# Patient Record
Sex: Female | Born: 1965 | Race: White | Hispanic: No | Marital: Married | Smoking: Never smoker
Health system: Southern US, Community
[De-identification: ages and names within clinical notes are randomized; demographics above are authoritative.]

## PROBLEM LIST (undated history)

## (undated) DIAGNOSIS — K219 Gastro-esophageal reflux disease without esophagitis: Secondary | ICD-10-CM

## (undated) DIAGNOSIS — M1990 Unspecified osteoarthritis, unspecified site: Secondary | ICD-10-CM

## (undated) DIAGNOSIS — E119 Type 2 diabetes mellitus without complications: Secondary | ICD-10-CM

## (undated) DIAGNOSIS — F329 Major depressive disorder, single episode, unspecified: Secondary | ICD-10-CM

## (undated) DIAGNOSIS — E785 Hyperlipidemia, unspecified: Secondary | ICD-10-CM

## (undated) DIAGNOSIS — I10 Essential (primary) hypertension: Secondary | ICD-10-CM

## (undated) DIAGNOSIS — F419 Anxiety disorder, unspecified: Secondary | ICD-10-CM

## (undated) DIAGNOSIS — Z87442 Personal history of urinary calculi: Secondary | ICD-10-CM

## (undated) DIAGNOSIS — F32A Depression, unspecified: Secondary | ICD-10-CM

## (undated) HISTORY — DX: Depression, unspecified: F32.A

## (undated) HISTORY — DX: Hyperlipidemia, unspecified: E78.5

## (undated) HISTORY — DX: Type 2 diabetes mellitus without complications: E11.9

## (undated) HISTORY — DX: Essential (primary) hypertension: I10

## (undated) HISTORY — DX: Anxiety disorder, unspecified: F41.9

## (undated) HISTORY — DX: Major depressive disorder, single episode, unspecified: F32.9

---

## 2004-05-21 ENCOUNTER — Ambulatory Visit: Payer: Self-pay | Admitting: Family Medicine

## 2007-11-29 ENCOUNTER — Ambulatory Visit: Payer: Self-pay | Admitting: Obstetrics and Gynecology

## 2008-02-15 HISTORY — PX: BACK SURGERY: SHX140

## 2008-12-25 ENCOUNTER — Ambulatory Visit: Payer: Self-pay

## 2008-12-31 ENCOUNTER — Ambulatory Visit: Payer: Self-pay

## 2009-01-01 ENCOUNTER — Ambulatory Visit: Payer: Self-pay | Admitting: Obstetrics and Gynecology

## 2009-02-04 ENCOUNTER — Ambulatory Visit: Payer: Self-pay | Admitting: Unknown Physician Specialty

## 2009-02-10 ENCOUNTER — Ambulatory Visit: Payer: Self-pay | Admitting: Unknown Physician Specialty

## 2010-08-26 ENCOUNTER — Ambulatory Visit: Payer: Self-pay | Admitting: Obstetrics and Gynecology

## 2011-08-30 ENCOUNTER — Ambulatory Visit: Payer: Self-pay | Admitting: Obstetrics and Gynecology

## 2012-09-14 HISTORY — PX: BREAST BIOPSY: SHX20

## 2012-09-19 ENCOUNTER — Ambulatory Visit: Payer: Self-pay | Admitting: Obstetrics and Gynecology

## 2012-09-27 ENCOUNTER — Ambulatory Visit: Payer: Self-pay | Admitting: Obstetrics and Gynecology

## 2012-09-27 ENCOUNTER — Encounter: Payer: Self-pay | Admitting: General Surgery

## 2012-10-04 ENCOUNTER — Ambulatory Visit (INDEPENDENT_AMBULATORY_CARE_PROVIDER_SITE_OTHER): Payer: BC Managed Care – PPO | Admitting: General Surgery

## 2012-10-04 ENCOUNTER — Encounter: Payer: Self-pay | Admitting: General Surgery

## 2012-10-04 VITALS — BP 130/72 | HR 76 | Resp 14 | Ht 67.0 in | Wt 277.0 lb

## 2012-10-04 DIAGNOSIS — N63 Unspecified lump in unspecified breast: Secondary | ICD-10-CM

## 2012-10-04 DIAGNOSIS — R928 Other abnormal and inconclusive findings on diagnostic imaging of breast: Secondary | ICD-10-CM

## 2012-10-04 NOTE — Patient Instructions (Addendum)

## 2012-10-04 NOTE — Progress Notes (Signed)
Patient ID: [name], female   DOB: 1965-04-08, 47 y.o.   MRN: [id number]  Chief Complaint  Patient presents with  . Other    New Patient Cat 4 Mammogram     HPI Brenda Hancock is a 47 y.o. female who presents for a breast evaluation. The most recent mammogram on 09/19/12 with birad category 4. She performs self breast checks and gets regular mammograms done. She denies any problems with her breasts at this time. No known family history of breast problems.  The patient is accompanied by her mother who was present for the interview and exam. HPI  Past Medical History  Diagnosis Date  . Diabetes mellitus without complication   . Anxiety   . Depression   . Hypertension   . Hyperlipidemia     Past Surgical History  Procedure Laterality Date  . Back surgery  2010    History reviewed. No pertinent family history.  Social History History  Substance Use Topics  . Smoking status: Never Smoker   . Smokeless tobacco: Not on file  . Alcohol Use: Yes    Allergies  Allergen Reactions  . Augmentin [Amoxicillin-Pot Clavulanate] Rash  . Keflex [Cephalexin] Rash    Current Outpatient Prescriptions  Medication Sig Dispense Refill  . buPROPion (WELLBUTRIN SR) 200 MG 12 hr tablet Take 1 tablet by mouth daily.      . hydrochlorothiazide (HYDRODIURIL) 25 MG tablet Take 1 tablet by mouth daily.      Marland Kitchen lisinopril (PRINIVIL,ZESTRIL) 10 MG tablet Take 1 tablet by mouth daily.      . pioglitazone (ACTOS) 30 MG tablet Take 1 tablet by mouth daily.      . simvastatin (ZOCOR) 20 MG tablet Take 1 tablet by mouth daily.      Marland Kitchen topiramate (TOPAMAX) 25 MG capsule Take 1 capsule by mouth daily.      Marland Kitchen venlafaxine XR (EFFEXOR-XR) 150 MG 24 hr capsule Take 1 capsule by mouth daily.       No current facility-administered medications for this visit.    Review of Systems Review of Systems  Constitutional: Negative.   Respiratory: Negative.   Cardiovascular: Negative.     Blood pressure 130/72,  pulse 76, resp. rate 14, height 5\' 7"  (1.702 m), weight 277 lb (125.646 kg), last menstrual period 09/06/2012.  Physical Exam Physical Exam  Constitutional: She is oriented to person, place, and time. She appears well-developed and well-nourished.  Neck: No thyromegaly present.  Cardiovascular: Normal rate, regular rhythm and normal heart sounds.   No murmur heard. Pulmonary/Chest: Effort normal and breath sounds normal. Right breast exhibits no inverted nipple, no mass, no nipple discharge, no skin change and no tenderness. Left breast exhibits no inverted nipple, no mass, no nipple discharge, no skin change and no tenderness.  Left breast bigger than right breast.   Lymphadenopathy:    She has no cervical adenopathy.    She has no axillary adenopathy.  Neurological: She is alert and oriented to person, place, and time.  Skin: Skin is warm and dry.    Data Reviewed Bilateral mammogram [place] 6, 2014 suggest a left retroareolar focal asymmetry. Additional views requested. BI-RAD-0.  Focal spot compression views and ultrasound dated September 27, 2012 identified a 4 x 6 x 11 mm bilobed hypoechoic mass at the 3:00 position the retroareolar area. BI-RAD-4.  Ultrasound examination showed in the superficial tissue immediately adjacent to the base of the nipple at the 3:00 position a 0.6 x 1.1 x  1.25 cm hypoechoic area. This corresponded to the hospital study.  The patient was amenable to back and biopsy.  10 cc of 0.5% Xylocaine with 0.25% Marcaine with 1-200,000 epinephrine was utilized well-tolerated. ChloraPrep was applied to the skin. A 10-gauge Encor device septum 11 mm sample was placed under oversewn guidance and a core samples obtained. Moderate discomfort was appreciated one was superficial biopsies. Scant bleeding was noted. Pain resolved by the end of the procedure. A postbiopsy clip was placed. Skin defect was closed with benzoin and Steri-Strips followed by Telfa and Tegaderm  dressing. Written instructions were provided.  Assessment    Abnormal mammogram. Possible related to ductal dilatation.     Plan    The patient will be contacted when the biopsy results are available. She is aware this will likely be Monday, August 25 of the late hour.        Earline Mayotte 10/05/2012, 7:59 PM

## 2012-10-05 ENCOUNTER — Encounter: Payer: Self-pay | Admitting: General Surgery

## 2012-10-05 DIAGNOSIS — R928 Other abnormal and inconclusive findings on diagnostic imaging of breast: Secondary | ICD-10-CM | POA: Insufficient documentation

## 2012-10-08 LAB — PATHOLOGY

## 2012-10-11 ENCOUNTER — Ambulatory Visit (INDEPENDENT_AMBULATORY_CARE_PROVIDER_SITE_OTHER): Payer: BC Managed Care – PPO | Admitting: *Deleted

## 2012-10-11 DIAGNOSIS — N63 Unspecified lump in unspecified breast: Secondary | ICD-10-CM

## 2012-10-11 NOTE — Patient Instructions (Signed)
Continue self breast exams. Call office for any new breast issues or concerns. 

## 2012-10-11 NOTE — Progress Notes (Signed)
Patient here today for follow up post left breast biopsy.  Small rash noted from dressing, may use benadryl as needed.  Minimal bruising noted.  The patient is aware that a heating pad may be used for comfort as needed.  Aware of pathology. Follow up as scheduled 6 months.

## 2012-10-30 ENCOUNTER — Encounter: Payer: Self-pay | Admitting: General Surgery

## 2013-03-26 ENCOUNTER — Ambulatory Visit: Payer: Self-pay | Admitting: General Surgery

## 2013-03-26 ENCOUNTER — Encounter: Payer: Self-pay | Admitting: General Surgery

## 2013-04-09 ENCOUNTER — Ambulatory Visit: Payer: BC Managed Care – PPO | Admitting: General Surgery

## 2013-04-24 ENCOUNTER — Encounter: Payer: Self-pay | Admitting: General Surgery

## 2013-04-24 ENCOUNTER — Ambulatory Visit (INDEPENDENT_AMBULATORY_CARE_PROVIDER_SITE_OTHER): Payer: BC Managed Care – PPO | Admitting: General Surgery

## 2013-04-24 VITALS — BP 122/72 | HR 76 | Resp 12 | Ht 67.0 in | Wt 263.0 lb

## 2013-04-24 DIAGNOSIS — R928 Other abnormal and inconclusive findings on diagnostic imaging of breast: Secondary | ICD-10-CM

## 2013-04-24 NOTE — Progress Notes (Signed)
Patient ID: Brenda Hancock, female   DOB: Jan 02, 1966, 48 y.o.   MRN: [id number]  Chief Complaint  Patient presents with  . Follow-up    6 month follow up left diagnostic mammogram     HPI Brenda Hancock is a 48 y.o. female who presents for a breast evaluation. The most recent left breast mammogram was done on 03/26/13. Patient does perform regular self breast checks and gets regular mammograms done.  The patient denies any problems with her breasts at this time.    HPI  Past Medical History  Diagnosis Date  . Diabetes mellitus without complication   . Anxiety   . Depression   . Hypertension   . Hyperlipidemia     Past Surgical History  Procedure Laterality Date  . Back surgery  2010    History reviewed. No pertinent family history.  Social History History  Substance Use Topics  . Smoking status: Never Smoker   . Smokeless tobacco: Never Used  . Alcohol Use: Yes    Allergies  Allergen Reactions  . Augmentin [Amoxicillin-Pot Clavulanate] Rash  . Keflex [Cephalexin] Rash    Current Outpatient Prescriptions  Medication Sig Dispense Refill  . buPROPion (WELLBUTRIN SR) 200 MG 12 hr tablet Take 1 tablet by mouth daily.      . hydrochlorothiazide (HYDRODIURIL) 25 MG tablet Take 1 tablet by mouth daily.      Marland Kitchen. lisinopril (PRINIVIL,ZESTRIL) 10 MG tablet Take 1 tablet by mouth daily.      . pioglitazone (ACTOS) 30 MG tablet Take 1 tablet by mouth daily.      . simvastatin (ZOCOR) 20 MG tablet Take 1 tablet by mouth daily.      Marland Kitchen. topiramate (TOPAMAX) 25 MG capsule Take 1 capsule by mouth daily.      Marland Kitchen. venlafaxine XR (EFFEXOR-XR) 150 MG 24 hr capsule Take 1 capsule by mouth daily.       No current facility-administered medications for this visit.    Review of Systems Review of Systems  Constitutional: Negative.   Respiratory: Negative.   Cardiovascular: Negative.     Blood pressure 122/72, pulse 76, resp. rate 12, height 5\' 7"  (1.702 m), weight 263 lb (119.296 kg), last  menstrual period 03/20/2013.  Physical Exam Physical Exam  Constitutional: She is oriented to person, place, and time. She appears well-developed and well-nourished.  Eyes: Conjunctivae are normal.  Neck: Neck supple.  Cardiovascular: Normal rate, regular rhythm and normal heart sounds.   Pulmonary/Chest: Effort normal and breath sounds normal. Right breast exhibits no inverted nipple, no mass, no nipple discharge, no skin change and no tenderness. Left breast exhibits no inverted nipple, no mass, no nipple discharge, no skin change and no tenderness.  Right breast focal  thickening right upper quadrant. Left breast focal thickening in the left upper quadrant.  Lymphadenopathy:    She has no cervical adenopathy.    She has no axillary adenopathy.  Neurological: She is alert and oriented to person, place, and time.  Skin: Skin is warm and dry.    Data Reviewed Left breast mammogram dated March 26, 2013 showed a postbiopsy marker in the subareolar area and left upper quadrant. Previously identified mass is no longer evident. BI-RAD-2.  Assessment    Benign breast exam and mammogram.     Plan    The patient should resume annual screening mammograms with her GYN in fall 2015.        Brenda MayotteByrnett, Brenda Hancock W 04/27/2013, 8:37 PM

## 2013-04-24 NOTE — Patient Instructions (Signed)
Continue self breast exams. Call office for any new breast issues or concerns. Patient to return as needed.   

## 2013-12-16 ENCOUNTER — Encounter: Payer: Self-pay | Admitting: General Surgery

## 2014-03-11 ENCOUNTER — Ambulatory Visit: Payer: Self-pay | Admitting: Obstetrics and Gynecology

## 2014-03-13 ENCOUNTER — Ambulatory Visit: Payer: Self-pay | Admitting: Obstetrics and Gynecology

## 2015-03-24 ENCOUNTER — Other Ambulatory Visit: Payer: Self-pay | Admitting: Obstetrics and Gynecology

## 2015-03-24 DIAGNOSIS — Z1231 Encounter for screening mammogram for malignant neoplasm of breast: Secondary | ICD-10-CM

## 2015-04-01 ENCOUNTER — Ambulatory Visit: Payer: Self-pay

## 2015-04-06 ENCOUNTER — Ambulatory Visit
Admission: RE | Admit: 2015-04-06 | Discharge: 2015-04-06 | Disposition: A | Discharge status: Home / Self Care | Payer: BC Managed Care – PPO | Source: Ambulatory Visit | Attending: Obstetrics and Gynecology | Admitting: Obstetrics and Gynecology

## 2015-04-06 DIAGNOSIS — Z1231 Encounter for screening mammogram for malignant neoplasm of breast: Secondary | ICD-10-CM | POA: Diagnosis present

## 2016-03-01 ENCOUNTER — Other Ambulatory Visit: Payer: Self-pay | Admitting: Obstetrics and Gynecology

## 2016-04-07 ENCOUNTER — Other Ambulatory Visit: Payer: Self-pay | Admitting: Obstetrics and Gynecology

## 2016-04-07 DIAGNOSIS — Z1231 Encounter for screening mammogram for malignant neoplasm of breast: Secondary | ICD-10-CM

## 2016-04-14 ENCOUNTER — Encounter (INDEPENDENT_AMBULATORY_CARE_PROVIDER_SITE_OTHER): Payer: Self-pay

## 2016-04-14 ENCOUNTER — Ambulatory Visit
Admission: RE | Admit: 2016-04-14 | Discharge: 2016-04-14 | Disposition: A | Discharge status: Home / Self Care | Payer: BC Managed Care – PPO | Source: Ambulatory Visit | Attending: Obstetrics and Gynecology | Admitting: Obstetrics and Gynecology

## 2016-04-14 ENCOUNTER — Other Ambulatory Visit: Payer: Self-pay | Admitting: Obstetrics and Gynecology

## 2016-04-14 DIAGNOSIS — Z1231 Encounter for screening mammogram for malignant neoplasm of breast: Secondary | ICD-10-CM | POA: Insufficient documentation

## 2016-07-06 ENCOUNTER — Ambulatory Visit: Payer: BC Managed Care – PPO | Attending: Obstetrics and Gynecology | Admitting: Physical Therapy

## 2016-07-13 ENCOUNTER — Encounter: Payer: BC Managed Care – PPO | Admitting: Physical Therapy

## 2016-07-20 ENCOUNTER — Encounter: Payer: BC Managed Care – PPO | Admitting: Physical Therapy

## 2016-07-27 ENCOUNTER — Encounter: Payer: BC Managed Care – PPO | Admitting: Physical Therapy

## 2016-08-10 ENCOUNTER — Encounter: Payer: BC Managed Care – PPO | Admitting: Physical Therapy

## 2016-08-24 ENCOUNTER — Encounter: Payer: BC Managed Care – PPO | Admitting: Physical Therapy

## 2016-09-07 ENCOUNTER — Encounter: Payer: BC Managed Care – PPO | Admitting: Physical Therapy

## 2017-04-20 ENCOUNTER — Other Ambulatory Visit: Payer: Self-pay | Admitting: Obstetrics and Gynecology

## 2017-04-20 DIAGNOSIS — Z1231 Encounter for screening mammogram for malignant neoplasm of breast: Secondary | ICD-10-CM

## 2017-05-01 ENCOUNTER — Encounter (INDEPENDENT_AMBULATORY_CARE_PROVIDER_SITE_OTHER): Payer: Self-pay

## 2017-05-01 ENCOUNTER — Ambulatory Visit
Admission: RE | Admit: 2017-05-01 | Discharge: 2017-05-01 | Disposition: A | Discharge status: Home / Self Care | Payer: BC Managed Care – PPO | Source: Ambulatory Visit | Attending: Obstetrics and Gynecology | Admitting: Obstetrics and Gynecology

## 2017-05-01 DIAGNOSIS — Z1231 Encounter for screening mammogram for malignant neoplasm of breast: Secondary | ICD-10-CM

## 2018-04-01 ENCOUNTER — Ambulatory Visit
Admission: EM | Admit: 2018-04-01 | Discharge: 2018-04-01 | Disposition: A | Discharge status: Home / Self Care | Payer: BC Managed Care – PPO | Attending: Emergency Medicine | Admitting: Emergency Medicine

## 2018-04-01 DIAGNOSIS — J0140 Acute pansinusitis, unspecified: Secondary | ICD-10-CM | POA: Diagnosis not present

## 2018-04-01 MED ORDER — IBUPROFEN 600 MG PO TABS: 600.0000 mg | ORAL_TABLET | Freq: Four times a day (QID) | ORAL | 0 refills | Status: DC | PRN

## 2018-04-01 MED ORDER — DOXYCYCLINE HYCLATE 100 MG PO CAPS: 100.0000 mg | ORAL_CAPSULE | Freq: Two times a day (BID) | ORAL | 0 refills | Status: AC

## 2018-04-01 NOTE — ED Triage Notes (Signed)
Pt states she is here for sinus infection that has been going on for 3 days. Also states this is her 3rd sinus infection since christmas and the past 2 times she was treated with doxycycline. Facial and sinus pain, eyes burning. Has been taking mucinex sinus without relief.

## 2018-04-01 NOTE — ED Provider Notes (Signed)
HPI  SUBJECTIVE:  Brenda Hancock is a 53 y.o. female who presents with a "sinus infection" for the past 3 days.  States that she has had 2 sinus infections since Christmas.  She did not seek medical attention for the first 1 but took some leftover antibiotics which "cleared it up".  She was then seen by her PMD on January 9 for the same, treated with doxycycline for 10 days and she finished this on 1/19.  States that she got completely better after this course of antibiotics.  She reports nasal congestion, rhinorrhea, postnasal drip, sinus pain pressure, upper dental pain.  She states that her right ear feels clogged, but denies fevers.  She reports a mild cough.  No fevers, facial swelling.  No allergy symptoms.  No antipyretic in the past 4 to 6 hours.  She has tried saline spray, Mucinex sinus and Flonase without improvement in her symptoms.  Symptoms are worse with bending forward.  Past medical history of sinusitis, diabetes, hypertension, allergies.  JEH:[id number], Madaline Guthrie, MD   Past Medical History:  Diagnosis Date  . Anxiety   . Depression   . Diabetes mellitus without complication (HCC)   . Hyperlipidemia   . Hypertension     Past Surgical History:  Procedure Laterality Date  . BACK SURGERY  2010  . BREAST BIOPSY Left 09/2012   neg bx/clip    Family History  Problem Relation Age of Onset  . Hypertension Mother   . Diabetes Mother   . Cancer Father   . Hypertension Father   . Diabetes Father   . Breast cancer Neg Hx     Social History   Tobacco Use  . Smoking status: Never Smoker  . Smokeless tobacco: Never Used  Substance Use Topics  . Alcohol use: Yes  . Drug use: No    No current facility-administered medications for this encounter.   Current Outpatient Medications:  .  buPROPion (WELLBUTRIN SR) 200 MG 12 hr tablet, Take 1 tablet by mouth daily., Disp: , Rfl:  .  hydrochlorothiazide (HYDRODIURIL) 25 MG tablet, Take 1 tablet by mouth daily., Disp: , Rfl:   .  Levonorgestrel (LILETTA) 19.5 MCG/DAY IUD IUD, 1 each by Intrauterine route once., Disp: , Rfl:  .  lisinopril (PRINIVIL,ZESTRIL) 10 MG tablet, Take 1 tablet by mouth daily., Disp: , Rfl:  .  pioglitazone (ACTOS) 30 MG tablet, Take 1 tablet by mouth daily., Disp: , Rfl:  .  simvastatin (ZOCOR) 20 MG tablet, Take 1 tablet by mouth daily., Disp: , Rfl:  .  TRULICITY 0.75 MG/0.5ML SOPN, , Disp: , Rfl:  .  venlafaxine XR (EFFEXOR-XR) 150 MG 24 hr capsule, Take 1 capsule by mouth daily., Disp: , Rfl:  .  doxycycline (VIBRAMYCIN) 100 MG capsule, Take 1 capsule (100 mg total) by mouth 2 (two) times daily for 7 days., Disp: 14 capsule, Rfl: 0 .  ibuprofen (ADVIL,MOTRIN) 600 MG tablet, Take 1 tablet (600 mg total) by mouth every 6 (six) hours as needed., Disp: 30 tablet, Rfl: 0  Allergies  Allergen Reactions  . Augmentin [Amoxicillin-Pot Clavulanate] Rash  . Keflex [Cephalexin] Rash     ROS  As noted in HPI.   Physical Exam  BP (!) 158/89 (BP Location: Right Arm)   Pulse (!) 102   Temp 98.6 F (37 C) (Oral)   Resp 18   Ht 5\' 7"  (1.702 m)   Wt (!) 156 kg   SpO2 97%   BMI 53.88 kg/m  Constitutional: Well developed, well nourished, no acute distress Eyes:  EOMI, conjunctiva normal bilaterally HENT: Normocephalic, atraumatic,mucus membranes moist.  TMs normal bilaterally.  Purulent nasal congestion.  Normal turbinates.  Mild frontal sinus tenderness.  Positive maxillary sinus tenderness.  No obvious postnasal drip. Respiratory: Normal inspiratory effort, lungs clear bilaterally  cardiovascular: Normal rate GI: nondistended skin: No rash, skin intact Musculoskeletal: no deformities Neurologic: Alert & oriented x 3, no focal neuro deficits Psychiatric: Speech and behavior appropriate   ED Course   Medications - No data to display  No orders of the defined types were placed in this encounter.   No results found for this or any previous visit (from the past 24 hour(s)). No  results found.  ED Clinical Impression  Acute pansinusitis, recurrence not specified   ED Assessment/Plan  Outside records reviewed.  As noted in HPI.  Patient with a sinusitis, could be viral at this point in time.  Home with saline nasal irrigation with a Lloyd Huger med rinse, salt water, continue Flonase, Mucinex D in the morning, regular Mucinex at night, ibuprofen 600 mg combined with 1 g of Tylenol 3 or 4 times a day as needed for pain, wait-and-see prescription of doxycycline as it has been almost a month since she took it.  Follow Up with PMD as needed  DiscussedMDM, treatment plan, and plan for follow-up with patient.  patient agrees with plan.   Meds ordered this encounter  Medications  . doxycycline (VIBRAMYCIN) 100 MG capsule    Sig: Take 1 capsule (100 mg total) by mouth 2 (two) times daily for 7 days.    Dispense:  14 capsule    Refill:  0  . ibuprofen (ADVIL,MOTRIN) 600 MG tablet    Sig: Take 1 tablet (600 mg total) by mouth every 6 (six) hours as needed.    Dispense:  30 tablet    Refill:  0    *This clinic note was created using Scientist, clinical (histocompatibility and immunogenetics). Therefore, there may be occasional mistakes despite careful proofreading.   ?    Domenick Gong, MD 04/02/18 [phone]

## 2018-04-01 NOTE — Discharge Instructions (Addendum)
Continue Flonase.  Start Mucinex-D the morning, pain Mucinex at night to keep the mucous thin and to decongest you.   You may take 600 mg of motrin with 1 gram of tylenol up to 3-4 times a day as needed for pain. This is an effective combination for pain.  Most sinus infections are viral and do not need antibiotics unless you have a high fever, have had this for 10 days, or you get better and then get sick again. Use a NeilMed sinus rinse as often as you want to to reduce nasal congestion. Follow the directions on the box.   You Finished the last round of doxycycline on 1/19, nearly a month ago, so I am sending you home with another prescription of doxycycline.  I would wait a few days to see if you get better with these other medications first.  If not, then go ahead and fill it.  Go to www.goodrx.com to look up your medications. This will give you a list of where you can find your prescriptions at the most affordable prices. Or you can ask the pharmacist what the cash price is. This is frequently cheaper than going through insurance.

## 2018-04-30 ENCOUNTER — Other Ambulatory Visit: Payer: Self-pay | Admitting: Obstetrics and Gynecology

## 2018-04-30 DIAGNOSIS — Z1231 Encounter for screening mammogram for malignant neoplasm of breast: Secondary | ICD-10-CM

## 2018-05-07 ENCOUNTER — Ambulatory Visit: Payer: BC Managed Care – PPO

## 2018-05-26 ENCOUNTER — Other Ambulatory Visit: Payer: Self-pay

## 2018-05-26 ENCOUNTER — Encounter: Payer: Self-pay | Admitting: Emergency Medicine

## 2018-05-26 ENCOUNTER — Emergency Department
Admission: EM | Admit: 2018-05-26 | Discharge: 2018-05-26 | Disposition: A | Discharge status: Home / Self Care | Payer: BC Managed Care – PPO | Attending: Student in an Organized Health Care Education/Training Program | Admitting: Student in an Organized Health Care Education/Training Program

## 2018-05-26 ENCOUNTER — Emergency Department: Payer: BC Managed Care – PPO

## 2018-05-26 DIAGNOSIS — I10 Essential (primary) hypertension: Secondary | ICD-10-CM | POA: Insufficient documentation

## 2018-05-26 DIAGNOSIS — Z79899 Other long term (current) drug therapy: Secondary | ICD-10-CM | POA: Diagnosis not present

## 2018-05-26 DIAGNOSIS — E119 Type 2 diabetes mellitus without complications: Secondary | ICD-10-CM | POA: Insufficient documentation

## 2018-05-26 DIAGNOSIS — R109 Unspecified abdominal pain: Secondary | ICD-10-CM | POA: Insufficient documentation

## 2018-05-26 LAB — CBC
HCT: 38.8 % (ref 36.0–46.0)
Hemoglobin: 12.6 g/dL (ref 12.0–15.0)
MCH: 27.5 pg (ref 26.0–34.0)
MCHC: 32.5 g/dL (ref 30.0–36.0)
MCV: 84.5 fL (ref 80.0–100.0)
Platelets: 431 10*3/uL — ABNORMAL HIGH (ref 150–400)
RBC: 4.59 MIL/uL (ref 3.87–5.11)
RDW: 13.8 % (ref 11.5–15.5)
WBC: 12.9 10*3/uL — ABNORMAL HIGH (ref 4.0–10.5)
nRBC: 0 % (ref 0.0–0.2)

## 2018-05-26 LAB — COMPREHENSIVE METABOLIC PANEL
ALT: 33 U/L (ref 0–44)
AST: 29 U/L (ref 15–41)
Albumin: 4.1 g/dL (ref 3.5–5.0)
Alkaline Phosphatase: 161 U/L — ABNORMAL HIGH (ref 38–126)
Anion gap: 12 (ref 5–15)
BUN: 18 mg/dL (ref 6–20)
CO2: 28 mmol/L (ref 22–32)
Calcium: 9.2 mg/dL (ref 8.9–10.3)
Chloride: 98 mmol/L (ref 98–111)
Creatinine, Ser: 0.55 mg/dL (ref 0.44–1.00)
GFR calc Af Amer: >60 mL/min (ref >60)
GFR calc non Af Amer: >60 mL/min (ref >60)
Glucose, Bld: 157 mg/dL — ABNORMAL HIGH (ref 70–99)
Potassium: 3.8 mmol/L (ref 3.5–5.1)
Sodium: 138 mmol/L (ref 135–145)
Total Bilirubin: 0.3 mg/dL (ref 0.3–1.2)
Total Protein: 8 g/dL (ref 6.5–8.1)

## 2018-05-26 LAB — URINALYSIS, COMPLETE (UACMP) WITH MICROSCOPIC
Bacteria, UA: NONE SEEN
Bilirubin Urine: NEGATIVE
Glucose, UA: NEGATIVE mg/dL
Ketones, ur: NEGATIVE mg/dL
Leukocytes,Ua: NEGATIVE
Nitrite: NEGATIVE
Protein, ur: NEGATIVE mg/dL
Specific Gravity, Urine: 1.031 — ABNORMAL HIGH (ref 1.005–1.030)
pH: 5 (ref 5.0–8.0)

## 2018-05-26 LAB — LIPASE, BLOOD: Lipase: 26 U/L (ref 11–51)

## 2018-05-26 LAB — POCT PREGNANCY, URINE: Preg Test, Ur: NEGATIVE

## 2018-05-26 MED ORDER — ONDANSETRON HCL 4 MG/2ML IJ SOLN
4.0000 mg | Freq: Once | INTRAMUSCULAR | Status: AC
  Administered 2018-05-26: 20:00:00 4 mg via INTRAVENOUS
  Filled 2018-05-26: qty 2

## 2018-05-26 MED ORDER — HYDROCODONE-ACETAMINOPHEN 5-325 MG PO TABS: 1.0000 | ORAL_TABLET | ORAL | 0 refills | Status: DC | PRN

## 2018-05-26 MED ORDER — MORPHINE SULFATE (PF) 4 MG/ML IV SOLN
4.0000 mg | INTRAVENOUS | Status: DC | PRN
  Administered 2018-05-26: 20:00:00 4 mg via INTRAVENOUS
  Filled 2018-05-26: qty 1

## 2018-05-26 NOTE — Discharge Instructions (Signed)

## 2018-05-26 NOTE — ED Triage Notes (Signed)
R flank pain since this am.

## 2018-05-26 NOTE — ED Provider Notes (Signed)
[facility] Emergency Department Provider Note    First MD Initiated Contact with Patient 05/26/18 1955     (approximate)  I have reviewed the triage vital signs and the nursing notes.   HISTORY  Chief Complaint Flank Pain    HPI Brenda Hancock is a 53 y.o. female below listed past medical history presents with acute onset right flank pain radiating to right anterior stomach.  States the pain is been fairly constant and mild to moderate in severity.  No fevers.  Does have some nausea but no vomiting.  No diarrhea.  Denies any dysuria or discharge.  No shortness of breath or chest pain.    Past Medical History:  Diagnosis Date   Anxiety    Depression    Diabetes mellitus without complication (HCC)    Hyperlipidemia    Hypertension    Family History  Problem Relation Age of Onset   Hypertension Mother    Diabetes Mother    Cancer Father    Hypertension Father    Diabetes Father    Breast cancer Neg Hx    Past Surgical History:  Procedure Laterality Date   BACK SURGERY  2010   BREAST BIOPSY Left 09/2012   neg bx/clip   Patient Active Problem List   Diagnosis Date Noted   Abnormal mammogram 10/05/2012      Prior to Admission medications   Medication Sig Start Date End Date Taking? Authorizing Provider  buPROPion (WELLBUTRIN SR) 200 MG 12 hr tablet Take 1 tablet by mouth daily. 10/03/12   [provider]  hydrochlorothiazide (HYDRODIURIL) 25 MG tablet Take 1 tablet by mouth daily. 09/03/12   [provider]  HYDROcodone-acetaminophen (NORCO) 5-325 MG tablet Take 1 tablet by mouth every 4 (four) hours as needed for moderate pain. 05/26/18   Willy Eddy, MD  ibuprofen (ADVIL,MOTRIN) 600 MG tablet Take 1 tablet (600 mg total) by mouth every 6 (six) hours as needed. 04/01/18   Domenick Gong, MD  Levonorgestrel (LILETTA) 19.5 MCG/DAY IUD IUD 1 each by Intrauterine route once.    [provider]    lisinopril (PRINIVIL,ZESTRIL) 10 MG tablet Take 1 tablet by mouth daily. 09/03/12   [provider]  pioglitazone (ACTOS) 30 MG tablet Take 1 tablet by mouth daily. 09/03/12   [provider]  simvastatin (ZOCOR) 20 MG tablet Take 1 tablet by mouth daily. 09/03/12   [provider]  TRULICITY 0.75 MG/0.5ML SOPN  11/08/17   [provider]  venlafaxine XR (EFFEXOR-XR) 150 MG 24 hr capsule Take 1 capsule by mouth daily. 09/29/12   [provider]    Allergies Augmentin [amoxicillin-pot clavulanate] and Keflex [cephalexin]    Social History Social History   Tobacco Use   Smoking status: Never Smoker   Smokeless tobacco: Never Used  Substance Use Topics   Alcohol use: Yes   Drug use: No    Review of Systems Patient denies headaches, rhinorrhea, blurry vision, numbness, shortness of breath, chest pain, edema, cough, abdominal pain, nausea, vomiting, diarrhea, dysuria, fevers, rashes or hallucinations unless otherwise stated above in HPI. ____________________________________________   PHYSICAL EXAM:  VITAL SIGNS: Vitals:   05/26/18 2100 05/26/18 2129  BP: (!) 148/92   Pulse: 76 77  Resp:    Temp:    SpO2: 98% 99%    Constitutional: Alert and oriented.  Eyes: Conjunctivae are normal.  Head: Atraumatic. Nose: No congestion/rhinnorhea. Mouth/Throat: Mucous membranes are moist.   Neck: No stridor. Painless ROM.  Cardiovascular: Normal rate, regular rhythm. Grossly normal heart sounds.  Good peripheral circulation. Respiratory: Normal respiratory effort.  No retractions. Lungs CTAB. Gastrointestinal: Soft and nontender in all four quadrants. No distention. No abdominal bruits. No CVA tenderness. Genitourinary:  Musculoskeletal: No lower extremity tenderness nor edema.  No joint effusions. Neurologic:  Normal speech and language. No gross focal neurologic deficits are appreciated. No facial droop Skin:  Skin is warm, dry and  intact. No rash noted. Psychiatric: Mood and affect are normal. Speech and behavior are normal.  ____________________________________________   LABS (all labs ordered are listed, but only abnormal results are displayed)  Results for orders placed or performed during the hospital encounter of 05/26/18 (from the past 24 hour(s))  Comprehensive metabolic panel     Status: Abnormal   Collection Time: 05/26/18  6:40 PM  Result Value Ref Range   Sodium 138 135 - 145 mmol/L   Potassium 3.8 3.5 - 5.1 mmol/L   Chloride 98 98 - 111 mmol/L   CO2 28 22 - 32 mmol/L   Glucose, Bld 157 (H) 70 - 99 mg/dL   BUN 18 6 - 20 mg/dL   Creatinine, Ser 1.61 0.44 - 1.00 mg/dL   Calcium 9.2 8.9 - 09.6 mg/dL   Total Protein 8.0 6.5 - 8.1 g/dL   Albumin 4.1 3.5 - 5.0 g/dL   AST 29 15 - 41 U/L   ALT 33 0 - 44 U/L   Alkaline Phosphatase 161 (H) 38 - 126 U/L   Total Bilirubin 0.3 0.3 - 1.2 mg/dL   GFR calc non Af Amer >60 >60 mL/min   GFR calc Af Amer >60 >60 mL/min   Anion gap 12 5 - 15  CBC     Status: Abnormal   Collection Time: 05/26/18  6:40 PM  Result Value Ref Range   WBC 12.9 (H) 4.0 - 10.5 K/uL   RBC 4.59 3.87 - 5.11 MIL/uL   Hemoglobin 12.6 12.0 - 15.0 g/dL   HCT 04.5 40.9 - 81.1 %   MCV 84.5 80.0 - 100.0 fL   MCH 27.5 26.0 - 34.0 pg   MCHC 32.5 30.0 - 36.0 g/dL   RDW 91.4 78.2 - 95.6 %   Platelets 431 (H) 150 - 400 K/uL   nRBC 0.0 0.0 - 0.2 %  Lipase, blood     Status: None   Collection Time: 05/26/18  6:40 PM  Result Value Ref Range   Lipase 26 11 - 51 U/L  Urinalysis, Complete w Microscopic     Status: Abnormal   Collection Time: 05/26/18  6:40 PM  Result Value Ref Range   Color, Urine YELLOW (A) YELLOW   APPearance CLOUDY (A) CLEAR   Specific Gravity, Urine 1.031 (H) 1.005 - 1.030   pH 5.0 5.0 - 8.0   Glucose, UA NEGATIVE NEGATIVE mg/dL   Hgb urine dipstick MODERATE (A) NEGATIVE   Bilirubin Urine NEGATIVE NEGATIVE   Ketones, ur NEGATIVE NEGATIVE mg/dL   Protein, ur NEGATIVE  NEGATIVE mg/dL   Nitrite NEGATIVE NEGATIVE   Leukocytes,Ua NEGATIVE NEGATIVE   RBC / HPF 6-10 0 - 5 RBC/hpf   WBC, UA 0-5 0 - 5 WBC/hpf   Bacteria, UA NONE SEEN NONE SEEN   Squamous Epithelial / LPF 11-20 0 - 5   Mucus PRESENT   Pregnancy, urine POC     Status: None   Collection Time: 05/26/18  6:41 PM  Result Value Ref Range   Preg Test, Ur NEGATIVE NEGATIVE  ____________________________________________ ____________________________________________  RADIOLOGY  I personally reviewed all radiographic images ordered to evaluate for the above acute complaints and reviewed radiology reports and findings.  These findings were personally discussed with the patient.  Please see medical record for radiology report.  ____________________________________________   PROCEDURES  Procedure(s) performed:  Procedures    Critical Care performed: no ____________________________________________   INITIAL IMPRESSION / ASSESSMENT AND PLAN / ED COURSE  Pertinent labs & imaging results that were available during my care of the patient were reviewed by me and considered in my medical decision making (see chart for details).   DDX: stone, cholelithiasis, cholecystits, gastritis, enteritis  Brenda Hancock is a 53 y.o. who presents to the ED with right flank pain as described above.  She is afebrile and hemodynamically stable.  Her abdominal exam is soft benign in all 4 quadrants.  Have low suspicion for appendicitis.  Does have hematuria without any evidence of infection.  Based on her pain CT imaging will be ordered.  Also reporting that her mother currently has shingles.  I do not observe any evidence of shingles or rash.  CT imaging shows no acute abnormality.  Her pain is gone after IV pain medication.  At this point do believe she stable and appropriate for period of observation.  Discussed possibility of early cholecystitis or appendicitis but no indication for further diagnostic testing at  this time given her benign assessment.  Discussed need for return to the ER for repeat abdominal exam if symptoms worsen.  Have discussed with the patient and available family all diagnostics and treatments performed thus far and all questions were answered to the best of my ability. The patient demonstrates understanding and agreement with plan.      The patient was evaluated in Emergency Department today for the symptoms described in the history of present illness. He/she was evaluated in the context of the global COVID-19 pandemic, which necessitated consideration that the patient might be at risk for infection with the SARS-CoV-2 virus that causes COVID-19. Institutional protocols and algorithms that pertain to the evaluation of patients at risk for COVID-19 are in a state of rapid change based on information released by regulatory bodies including the CDC and federal and state organizations. These policies and algorithms were followed during the patient's care in the ED.  As part of my medical decision making, I reviewed the following data within the electronic MEDICAL RECORD NUMBER Nursing notes reviewed and incorporated, Labs reviewed, notes from prior ED visits.   ____________________________________________   FINAL CLINICAL IMPRESSION(S) / ED DIAGNOSES  Final diagnoses:  Right flank pain      NEW MEDICATIONS STARTED DURING THIS VISIT:  New Prescriptions   HYDROCODONE-ACETAMINOPHEN (NORCO) 5-325 MG TABLET    Take 1 tablet by mouth every 4 (four) hours as needed for moderate pain.     Note:  This document was prepared using Dragon voice recognition software and may include unintentional dictation errors.    Willy Eddyobinson, Marqual Mi, MD 05/26/18 2136

## 2018-06-18 ENCOUNTER — Ambulatory Visit: Payer: BC Managed Care – PPO

## 2018-07-06 ENCOUNTER — Other Ambulatory Visit: Payer: Self-pay | Admitting: Family Medicine

## 2018-07-06 DIAGNOSIS — R748 Abnormal levels of other serum enzymes: Secondary | ICD-10-CM

## 2018-07-11 ENCOUNTER — Ambulatory Visit
Admission: RE | Admit: 2018-07-11 | Discharge: 2018-07-11 | Disposition: A | Discharge status: Home / Self Care | Payer: BC Managed Care – PPO | Source: Ambulatory Visit | Attending: Family Medicine | Admitting: Family Medicine

## 2018-07-11 ENCOUNTER — Other Ambulatory Visit: Payer: Self-pay

## 2018-07-11 DIAGNOSIS — R748 Abnormal levels of other serum enzymes: Secondary | ICD-10-CM | POA: Diagnosis present

## 2018-07-19 ENCOUNTER — Other Ambulatory Visit: Payer: Self-pay

## 2018-07-19 ENCOUNTER — Ambulatory Visit
Admission: RE | Admit: 2018-07-19 | Discharge: 2018-07-19 | Disposition: A | Discharge status: Home / Self Care | Payer: BC Managed Care – PPO | Source: Ambulatory Visit | Attending: Obstetrics and Gynecology | Admitting: Obstetrics and Gynecology

## 2018-07-19 DIAGNOSIS — Z1231 Encounter for screening mammogram for malignant neoplasm of breast: Secondary | ICD-10-CM | POA: Diagnosis not present

## 2018-08-24 ENCOUNTER — Other Ambulatory Visit: Payer: Self-pay

## 2018-08-24 ENCOUNTER — Other Ambulatory Visit
Admission: RE | Admit: 2018-08-24 | Discharge: 2018-08-24 | Disposition: A | Discharge status: Home / Self Care | Payer: BC Managed Care – PPO | Source: Ambulatory Visit | Attending: Internal Medicine | Admitting: Internal Medicine

## 2018-08-24 DIAGNOSIS — Z1159 Encounter for screening for other viral diseases: Secondary | ICD-10-CM | POA: Insufficient documentation

## 2018-08-24 DIAGNOSIS — Z01812 Encounter for preprocedural laboratory examination: Secondary | ICD-10-CM | POA: Diagnosis present

## 2018-08-25 LAB — SARS CORONAVIRUS 2 (TAT 6-24 HRS): SARS Coronavirus 2: NEGATIVE

## 2018-08-28 ENCOUNTER — Encounter: Payer: Self-pay | Admitting: *Deleted

## 2018-08-29 ENCOUNTER — Ambulatory Visit: Payer: BC Managed Care – PPO | Admitting: Anesthesiology

## 2018-08-29 ENCOUNTER — Ambulatory Visit
Admission: RE | Admit: 2018-08-29 | Discharge: 2018-08-29 | Disposition: A | Discharge status: Home / Self Care | Payer: BC Managed Care – PPO | Attending: Internal Medicine | Admitting: Internal Medicine

## 2018-08-29 ENCOUNTER — Encounter: Payer: Self-pay | Admitting: *Deleted

## 2018-08-29 ENCOUNTER — Encounter
Admission: RE | Disposition: A | Discharge status: Home / Self Care | Payer: Self-pay | Source: Home / Self Care | Attending: Internal Medicine

## 2018-08-29 DIAGNOSIS — F329 Major depressive disorder, single episode, unspecified: Secondary | ICD-10-CM | POA: Diagnosis not present

## 2018-08-29 DIAGNOSIS — I10 Essential (primary) hypertension: Secondary | ICD-10-CM | POA: Diagnosis not present

## 2018-08-29 DIAGNOSIS — E785 Hyperlipidemia, unspecified: Secondary | ICD-10-CM | POA: Insufficient documentation

## 2018-08-29 DIAGNOSIS — Z793 Long term (current) use of hormonal contraceptives: Secondary | ICD-10-CM | POA: Diagnosis not present

## 2018-08-29 DIAGNOSIS — K640 First degree hemorrhoids: Secondary | ICD-10-CM | POA: Diagnosis not present

## 2018-08-29 DIAGNOSIS — Z1211 Encounter for screening for malignant neoplasm of colon: Secondary | ICD-10-CM | POA: Insufficient documentation

## 2018-08-29 DIAGNOSIS — E119 Type 2 diabetes mellitus without complications: Secondary | ICD-10-CM | POA: Insufficient documentation

## 2018-08-29 DIAGNOSIS — Z7984 Long term (current) use of oral hypoglycemic drugs: Secondary | ICD-10-CM | POA: Insufficient documentation

## 2018-08-29 DIAGNOSIS — Z79899 Other long term (current) drug therapy: Secondary | ICD-10-CM | POA: Diagnosis not present

## 2018-08-29 DIAGNOSIS — F419 Anxiety disorder, unspecified: Secondary | ICD-10-CM | POA: Insufficient documentation

## 2018-08-29 HISTORY — PX: COLONOSCOPY WITH PROPOFOL: SHX5780

## 2018-08-29 LAB — GLUCOSE, CAPILLARY: Glucose-Capillary: 130 mg/dL — ABNORMAL HIGH (ref 70–99)

## 2018-08-29 LAB — POCT PREGNANCY, URINE: Preg Test, Ur: NEGATIVE

## 2018-08-29 SURGERY — COLONOSCOPY WITH PROPOFOL
Anesthesia: General

## 2018-08-29 MED ORDER — PROPOFOL 500 MG/50ML IV EMUL
INTRAVENOUS | Status: DC | PRN
  Administered 2018-08-29: 175 ug/kg/min via INTRAVENOUS

## 2018-08-29 MED ORDER — SODIUM CHLORIDE 0.9 % IV SOLN
INTRAVENOUS | Status: DC
  Administered 2018-08-29: 13:00:00 via INTRAVENOUS

## 2018-08-29 MED ORDER — LIDOCAINE HCL (CARDIAC) PF 100 MG/5ML IV SOSY
PREFILLED_SYRINGE | INTRAVENOUS | Status: DC | PRN
  Administered 2018-08-29: 50 mg via INTRAVENOUS

## 2018-08-29 MED ORDER — PROPOFOL 10 MG/ML IV BOLUS
INTRAVENOUS | Status: DC | PRN
  Administered 2018-08-29 (×4): 50 mg via INTRAVENOUS

## 2018-08-29 NOTE — Anesthesia Preprocedure Evaluation (Signed)
Anesthesia Evaluation  Patient identified by MRN, date of birth, ID band Patient awake    Reviewed: Allergy & Precautions, NPO status , Patient's Chart, lab work & pertinent test results  History of Anesthesia Complications Negative for: history of anesthetic complications  Airway Mallampati: II  TM Distance: >3 FB Neck ROM: Full    Dental no notable dental hx.    Pulmonary neg pulmonary ROS, neg sleep apnea, neg COPD,    breath sounds clear to auscultation- rhonchi (-) wheezing      Cardiovascular hypertension, Pt. on medications (-) CAD, (-) Past MI, (-) Cardiac Stents and (-) CABG  Rhythm:Regular Rate:Normal - Systolic murmurs and - Diastolic murmurs    Neuro/Psych neg Seizures PSYCHIATRIC DISORDERS Anxiety Depression negative neurological ROS     GI/Hepatic negative GI ROS, Neg liver ROS,   Endo/Other  diabetes, Oral Hypoglycemic Agents  Renal/GU negative Renal ROS     Musculoskeletal negative musculoskeletal ROS (+)   Abdominal (+) + obese,   Peds  Hematology negative hematology ROS (+)   Anesthesia Other Findings Past Medical History: No date: Anxiety No date: Depression No date: Diabetes mellitus without complication (HCC) No date: Hyperlipidemia No date: Hypertension   Reproductive/Obstetrics                             Anesthesia Physical Anesthesia Plan  ASA: II  Anesthesia Plan: General   Post-op Pain Management:    Induction: Intravenous  PONV Risk Score and Plan: 2 and Propofol infusion  Airway Management Planned: Natural Airway  Additional Equipment:   Intra-op Plan:   Post-operative Plan:   Informed Consent: I have reviewed the patients History and Physical, chart, labs and discussed the procedure including the risks, benefits and alternatives for the proposed anesthesia with the patient or authorized representative who has indicated his/her understanding  and acceptance.     Dental advisory given  Plan Discussed with: CRNA and Anesthesiologist  Anesthesia Plan Comments:         Anesthesia Quick Evaluation

## 2018-08-29 NOTE — Anesthesia Postprocedure Evaluation (Signed)
Anesthesia Post Note  Patient: Brenda Hancock  Procedure(s) Performed: COLONOSCOPY WITH PROPOFOL (N/A )  Patient location during evaluation: Endoscopy Anesthesia Type: General Level of consciousness: awake and alert and oriented Pain management: pain level controlled Vital Signs Assessment: post-procedure vital signs reviewed and stable Respiratory status: spontaneous breathing, nonlabored ventilation and respiratory function stable Cardiovascular status: blood pressure returned to baseline and stable Postop Assessment: no signs of nausea or vomiting Anesthetic complications: no     Last Vitals:  Vitals:   08/29/18 1350 08/29/18 1400  BP: 122/84 133/74  Pulse: 91 77  Resp: 19 (!) 22  Temp: (!) 36.2 C   SpO2: 99% 100%    Last Pain:  Vitals:   08/29/18 1410  TempSrc:   PainSc: (P) 0-No pain                 Alivea Gladson

## 2018-08-29 NOTE — Transfer of Care (Signed)
Immediate Anesthesia Transfer of Care Note  Patient: Brenda Hancock  Procedure(s) Performed: COLONOSCOPY WITH PROPOFOL (N/A )  Patient Location: PACU  Anesthesia Type:General  Level of Consciousness: drowsy  Airway & Oxygen Therapy: Patient Spontanous Breathing and Patient connected to nasal cannula oxygen  Post-op Assessment: Report given to RN and Post -op Vital signs reviewed and stable  Post vital signs: Reviewed and stable  Last Vitals:  Vitals Value Taken Time  BP 122/84 08/29/18 1350  Temp 36.2 C 08/29/18 1350  Pulse 89 08/29/18 1352  Resp 17 08/29/18 1352  SpO2 100 % 08/29/18 1352  Vitals shown include unvalidated device data.  Last Pain:  Vitals:   08/29/18 1350  TempSrc: Tympanic  PainSc: Asleep         Complications: No apparent anesthesia complications

## 2018-08-29 NOTE — Anesthesia Procedure Notes (Signed)
Date/Time: 08/29/2018 1:26 PM Performed by: Johnna Acosta, CRNA Pre-anesthesia Checklist: Patient identified, Emergency Drugs available, Suction available, Patient being monitored and Timeout performed Patient Re-evaluated:Patient Re-evaluated prior to induction Oxygen Delivery Method: Nasal cannula Preoxygenation: Pre-oxygenation with 100% oxygen Induction Type: IV induction

## 2018-08-29 NOTE — Op Note (Signed)
[facility] Gastroenterology Patient Name: Brenda Hancock Procedure Date: 08/29/2018 1:10 PM MRN: [id number] Account #: [id number] Date of Birth: 1965/04/09 Admit Type: Inpatient Age: 53 Room: [facility] ENDO ROOM 1 Gender: Female Note Status: Finalized Procedure:            Colonoscopy Indications:          Screening for colorectal malignant neoplasm Providers:            Benay Pike. Toledo MD, MD Medicines:            Propofol per Anesthesia Complications:        No immediate complications. Procedure:            Pre-Anesthesia Assessment:                       - The risks and benefits of the procedure and the                        sedation options and risks were discussed with the                        patient. All questions were answered and informed                        consent was obtained.                       - Patient identification and proposed procedure were                        verified prior to the procedure by the nurse. The                        procedure was verified in the procedure room.                       - ASA Grade Assessment: III - A patient with severe                        systemic disease.                       - After reviewing the risks and benefits, the patient                        was deemed in satisfactory condition to undergo the                        procedure.                       After obtaining informed consent, the colonoscope was                        passed under direct vision. Throughout the procedure,                        the patient's blood pressure, pulse, and oxygen                        saturations were monitored continuously. The  Colonoscope was introduced through the anus and                        advanced to the the cecum, identified by appendiceal                        orifice and ileocecal valve. The colonoscopy was                        performed without difficulty. The patient  tolerated the                        procedure well. The quality of the bowel preparation                        was good. The ileocecal valve, appendiceal orifice, and                        rectum were photographed. Findings:      The perianal and digital rectal examinations were normal. Pertinent       negatives include normal sphincter tone and no palpable rectal lesions.      The colon (entire examined portion) appeared normal.      Non-bleeding internal hemorrhoids were found during retroflexion. The       hemorrhoids were Grade I (internal hemorrhoids that do not prolapse).      The exam was otherwise without abnormality. Impression:           - The entire examined colon is normal.                       - Non-bleeding internal hemorrhoids.                       - The examination was otherwise normal.                       - No specimens collected. Recommendation:       - Patient has a contact number available for                        emergencies. The signs and symptoms of potential                        delayed complications were discussed with the patient.                        Return to normal activities tomorrow. Written discharge                        instructions were provided to the patient.                       - Resume previous diet.                       - Continue present medications.                       - Repeat colonoscopy in 10 years for screening purposes.                       -  Return to GI office PRN.                       - The findings and recommendations were discussed with                        the patient. Procedure Code(s):    --- Professional ---                       [id number], Colorectal cancer screening; colonoscopy on                        individual not meeting criteria for high risk Diagnosis Code(s):    --- Professional ---                       K64.0, First degree hemorrhoids                       Z12.11, Encounter for screening for  malignant neoplasm                        of colon CPT copyright 2019 American Medical Association. All rights reserved. The codes documented in this report are preliminary and upon coder review may  be revised to meet current compliance requirements. Stanton Kidneyeodoro K Toledo MD, MD 08/29/2018 1:46:45 PM This report has been signed electronically. Number of Addenda: 0 Note Initiated On: 08/29/2018 1:10 PM Scope Withdrawal Time: 0 hours 6 minutes 18 seconds  Total Procedure Duration: 0 hours 11 minutes 54 seconds  Estimated Blood Loss: Estimated blood loss: none.      [facility]

## 2018-08-29 NOTE — H&P (Signed)
Outpatient short stay form Pre-procedure 08/29/2018 1:12 PM Teodoro K. Alice Reichert, M.D.  Primary Physician: Thereasa Distance, M.D>  Reason for visit:  Colon cancer screening  History of present illness:  Patient presents for colonoscopy for colon cancer screening. The patient denies complaints of abdominal pain, significant change in bowel habits, or rectal bleeding.     Current Facility-Administered Medications:  .  0.9 %  sodium chloride infusion, , Intravenous, Continuous, [place], Benay Pike, MD, Last Rate: 20 mL/hr at 08/29/18 1306  Medications Prior to Admission  Medication Sig Dispense Refill Last Dose  . buPROPion (WELLBUTRIN SR) 200 MG 12 hr tablet Take 1 tablet by mouth daily.   08/28/2018 at Unknown time  . hydrochlorothiazide (HYDRODIURIL) 25 MG tablet Take 1 tablet by mouth daily.   08/28/2018 at Unknown time  . HYDROcodone-acetaminophen (NORCO) 5-325 MG tablet Take 1 tablet by mouth every 4 (four) hours as needed for moderate pain. 6 tablet 0 Past Week at Unknown time  . ibuprofen (ADVIL,MOTRIN) 600 MG tablet Take 1 tablet (600 mg total) by mouth every 6 (six) hours as needed. 30 tablet 0 Past Week at Unknown time  . lisinopril (PRINIVIL,ZESTRIL) 10 MG tablet Take 1 tablet by mouth daily.   08/28/2018 at Unknown time  . pioglitazone (ACTOS) 30 MG tablet Take 1 tablet by mouth daily.   Past Week at Unknown time  . simvastatin (ZOCOR) 20 MG tablet Take 1 tablet by mouth daily.   08/28/2018 at Unknown time  . TRULICITY 2.70 WC/3.7SE SOPN    Past Week at Unknown time  . venlafaxine XR (EFFEXOR-XR) 150 MG 24 hr capsule Take 1 capsule by mouth daily.   08/28/2018 at Unknown time  . Levonorgestrel (LILETTA) 19.5 MCG/DAY IUD IUD 1 each by Intrauterine route once.        Allergies  Allergen Reactions  . Augmentin [Amoxicillin-Pot Clavulanate] Rash  . Keflex [Cephalexin] Rash     Past Medical History:  Diagnosis Date  . Anxiety   . Depression   . Diabetes mellitus without complication  ([place])   . Hyperlipidemia   . Hypertension     Review of systems:  Otherwise negative.    Physical Exam  Gen: Alert, oriented. Appears stated age.  HEENT: [place]/AT. PERRLA. Lungs: CTA, no wheezes. CV: RR nl S1, S2. Abd: soft, benign, no masses. BS+ Ext: No edema. Pulses 2+    Planned procedures: Proceed with colonoscopy. The patient understands the nature of the planned procedure, indications, risks, alternatives and potential complications including but not limited to bleeding, infection, perforation, damage to internal organs and possible oversedation/side effects from anesthesia. The patient agrees and gives consent to proceed.  Please refer to procedure notes for findings, recommendations and patient disposition/instructions.     Teodoro K. Alice Reichert, M.D. Gastroenterology 08/29/2018  1:12 PM

## 2018-08-29 NOTE — Interval H&P Note (Signed)
History and Physical Interval Note:  08/29/2018 1:12 PM  Brenda Hancock  has presented today for surgery, with the diagnosis of SCREENING.  The various methods of treatment have been discussed with the patient and family. After consideration of risks, benefits and other options for treatment, the patient has consented to  Procedure(s): COLONOSCOPY WITH PROPOFOL (N/A) as a surgical intervention.  The patient's history has been reviewed, patient examined, no change in status, stable for surgery.  I have reviewed the patient's chart and labs.  Questions were answered to the patient's satisfaction.     [place], [place]

## 2018-08-29 NOTE — Anesthesia Post-op Follow-up Note (Signed)
Anesthesia QCDR form completed.        

## 2018-08-30 ENCOUNTER — Encounter: Payer: Self-pay | Admitting: Internal Medicine

## 2018-09-29 ENCOUNTER — Ambulatory Visit: Admit: 2018-09-29 | Payer: BC Managed Care – PPO | Admitting: Internal Medicine

## 2018-09-29 SURGERY — COLONOSCOPY WITH PROPOFOL
Anesthesia: General

## 2019-04-14 ENCOUNTER — Ambulatory Visit: Payer: BC Managed Care – PPO | Attending: Internal Medicine

## 2019-04-14 DIAGNOSIS — Z23 Encounter for immunization: Secondary | ICD-10-CM

## 2019-04-14 NOTE — Progress Notes (Signed)
   Covid-19 Vaccination Clinic  Name:  Tonisha Silvey    MRN: [id number] DOB: 08-08-1965  04/14/2019  Ms. Collister was observed post Covid-19 immunization for 15 minutes without incidence. She was provided with Vaccine Information Sheet and instruction to access the V-Safe system.   Ms. Isip was instructed to call 911 with any severe reactions post vaccine: Marland Kitchen Difficulty breathing  . Swelling of your face and throat  . A fast heartbeat  . A bad rash all over your body  . Dizziness and weakness    Immunizations Administered    Name Date Dose VIS Date Route   Pfizer COVID-19 Vaccine 04/14/2019  9:27 AM 0.3 mL 01/25/2019 Intramuscular   Manufacturer: [employer], [employer]   Lot: [id number]   NDC: [address]-8

## 2019-05-07 ENCOUNTER — Ambulatory Visit: Payer: BC Managed Care – PPO | Attending: Internal Medicine

## 2019-05-07 DIAGNOSIS — Z23 Encounter for immunization: Secondary | ICD-10-CM

## 2019-05-07 NOTE — Progress Notes (Signed)
   Covid-19 Vaccination Clinic  Name:  Brenda Hancock    MRN: [id number] DOB: 04-28-1965  05/07/2019  [name] was observed post Covid-19 immunization for 15 minutes without incident. She was provided with Vaccine Information Sheet and instruction to access the V-Safe system.   [name] was instructed to call 911 with any severe reactions post vaccine: Marland Kitchen Difficulty breathing  . Swelling of face and throat  . A fast heartbeat  . A bad rash all over body  . Dizziness and weakness   Immunizations Administered    Name Date Dose VIS Date Route   Pfizer COVID-19 Vaccine 05/07/2019  4:01 PM 0.3 mL 01/25/2019 Intramuscular   Manufacturer: [employer], [employer]   Lot: [id number]   NDC: [address]-2

## 2019-09-05 ENCOUNTER — Other Ambulatory Visit: Payer: Self-pay | Admitting: Obstetrics and Gynecology

## 2019-09-05 DIAGNOSIS — Z1231 Encounter for screening mammogram for malignant neoplasm of breast: Secondary | ICD-10-CM

## 2019-09-10 ENCOUNTER — Ambulatory Visit
Admission: RE | Admit: 2019-09-10 | Discharge: 2019-09-10 | Disposition: A | Discharge status: Home / Self Care | Payer: BC Managed Care – PPO | Source: Ambulatory Visit | Attending: Obstetrics and Gynecology | Admitting: Obstetrics and Gynecology

## 2019-09-10 ENCOUNTER — Other Ambulatory Visit: Payer: Self-pay

## 2019-09-10 DIAGNOSIS — Z1231 Encounter for screening mammogram for malignant neoplasm of breast: Secondary | ICD-10-CM | POA: Insufficient documentation

## 2020-04-01 ENCOUNTER — Other Ambulatory Visit: Payer: Self-pay

## 2020-04-01 ENCOUNTER — Other Ambulatory Visit: Payer: Self-pay | Admitting: Orthopedic Surgery

## 2020-04-01 ENCOUNTER — Encounter
Admission: RE | Admit: 2020-04-01 | Discharge: 2020-04-01 | Disposition: A | Discharge status: Home / Self Care | Payer: BC Managed Care – PPO | Source: Ambulatory Visit | Attending: Orthopedic Surgery | Admitting: Orthopedic Surgery

## 2020-04-01 DIAGNOSIS — E119 Type 2 diabetes mellitus without complications: Secondary | ICD-10-CM | POA: Insufficient documentation

## 2020-04-01 DIAGNOSIS — I10 Essential (primary) hypertension: Secondary | ICD-10-CM | POA: Insufficient documentation

## 2020-04-01 DIAGNOSIS — Z01818 Encounter for other preprocedural examination: Secondary | ICD-10-CM | POA: Insufficient documentation

## 2020-04-01 HISTORY — DX: Personal history of urinary calculi: Z87.442

## 2020-04-01 HISTORY — DX: Unspecified osteoarthritis, unspecified site: M19.90

## 2020-04-01 HISTORY — DX: Gastro-esophageal reflux disease without esophagitis: K21.9

## 2020-04-01 NOTE — Patient Instructions (Addendum)
Your procedure is scheduled on:04-06-20 MONDAY Report to the Registration Desk on the 1st floor of the Medical Mall-Then proceed to the 2nd floor Surgery Desk in the Medical Mall To find out your arrival time, please call [phone] between 1PM - 3PM on:04-03-20 FRIDAY  REMEMBER: Instructions that are not followed completely may result in serious medical risk, up to and including death; or upon the discretion of your surgeon and anesthesiologist your surgery may need to be rescheduled.  Do not eat food after midnight the night before surgery.  No gum chewing, lozengers or hard candies.  You may however, drink WATER up to 2 hours before you are scheduled to arrive for your surgery. Do not drink anything within 2 hours of your scheduled arrival time.  Type 1 and Type 2 diabetics should only drink water.  TAKE THESE MEDICATIONS THE MORNING OF SURGERY WITH A SIP OF WATER: -NORVASC (AMLODIPINE) -WELLBUTRIN (BUPROPION) -ZOCOR (SIMVASTATIN) -EFFEXOR (VENLAFAXINE) -NEXIUM (ESOMEPRAZOLE)-take one the night before and one on the morning of surgery - helps to prevent nausea after surgery.)  One week prior to surgery: Stop Anti-inflammatories (NSAIDS) such as Advil, Aleve, Ibuprofen, Motrin, Naproxen, Naprosyn and Aspirin based products such as Excedrin, Goodys Powder, BC Powder-OK TO TAKE TYLENOL IF NEEDED  Stop ANY OVER THE COUNTER supplements until after surgery.  No Alcohol for 24 hours before or after surgery.  No Smoking including e-cigarettes for 24 hours prior to surgery.  No chewable tobacco products for at least 6 hours prior to surgery.  No nicotine patches on the day of surgery.  Do not use any "recreational" drugs for at least a week prior to your surgery.  Please be advised that the combination of cocaine and anesthesia may have negative outcomes, up to and including death. If you test positive for cocaine, your surgery will be cancelled.  On the morning of surgery brush your  teeth with toothpaste and water, you may rinse your mouth with mouthwash if you wish. Do not swallow any toothpaste or mouthwash.  Do not wear jewelry, make-up, hairpins, clips or nail polish.  Do not wear lotions, powders, or perfumes.   Do not shave body from the neck down 48 hours prior to surgery just in case you cut yourself which could leave a site for infection.  Also, freshly shaved skin may become irritated if using the CHG soap.  Contact lenses, hearing aids and dentures may not be worn into surgery.  Do not bring valuables to the hospital. [facility] is not responsible for any missing/lost belongings or valuables.   Use CHG Soap as directed on instruction sheet.  Notify your doctor if there is any change in your medical condition (cold, fever, infection).  Wear comfortable clothing (specific to your surgery type) to the hospital.  Plan for stool softeners for home use; pain medications have a tendency to cause constipation. You can also help prevent constipation by eating foods high in fiber such as fruits and vegetables and drinking plenty of fluids as your diet allows.  After surgery, you can help prevent lung complications by doing breathing exercises.  Take deep breaths and cough every 1-2 hours. Your doctor may order a device called an Incentive Spirometer to help you take deep breaths. When coughing or sneezing, hold a pillow firmly against your incision with both hands. This is called "splinting." Doing this helps protect your incision. It also decreases belly discomfort.  If you are being admitted to the hospital overnight, leave your suitcase in  the car. After surgery it may be brought to your room.  If you are being discharged the day of surgery, you will not be allowed to drive home. You will need a responsible adult (18 years or older) to drive you home and stay with you that night.   If you are taking public transportation, you will need to have a responsible  adult (18 years or older) with you. Please confirm with your physician that it is acceptable to use public transportation.   Please call the Pre-admissions Testing Dept. at [phone] if you have any questions about these instructions.  Visitation Policy:  Patients undergoing a surgery or procedure may have one family member or support person with them as long as that person is not COVID-19 positive or experiencing its symptoms.  That person may remain in the waiting area during the procedure.  Inpatient Visitation:    Visiting hours are 7 a.m. to 8 p.m. Patients will be allowed one visitor. The visitor may change daily. The visitor must pass COVID-19 screenings, use hand sanitizer when entering and exiting the patient's room and wear a mask at all times, including in the patient's room. Patients must also wear a mask when staff or their visitor are in the room. Masking is required regardless of vaccination status. Systemwide, no visitors 17 or younger.

## 2020-04-02 ENCOUNTER — Encounter
Admission: RE | Admit: 2020-04-02 | Discharge: 2020-04-02 | Disposition: A | Discharge status: Home / Self Care | Payer: No Typology Code available for payment source | Source: Ambulatory Visit | Attending: Orthopedic Surgery | Admitting: Orthopedic Surgery

## 2020-04-02 ENCOUNTER — Other Ambulatory Visit: Payer: BC Managed Care – PPO

## 2020-04-02 DIAGNOSIS — E119 Type 2 diabetes mellitus without complications: Secondary | ICD-10-CM | POA: Diagnosis not present

## 2020-04-02 DIAGNOSIS — Z01812 Encounter for preprocedural laboratory examination: Secondary | ICD-10-CM | POA: Diagnosis present

## 2020-04-02 DIAGNOSIS — I10 Essential (primary) hypertension: Secondary | ICD-10-CM | POA: Diagnosis not present

## 2020-04-02 DIAGNOSIS — Z20822 Contact with and (suspected) exposure to covid-19: Secondary | ICD-10-CM | POA: Diagnosis not present

## 2020-04-02 LAB — BASIC METABOLIC PANEL
Anion gap: 8 (ref 5–15)
BUN: 19 mg/dL (ref 6–20)
CO2: 27 mmol/L (ref 22–32)
Calcium: 9.4 mg/dL (ref 8.9–10.3)
Chloride: 100 mmol/L (ref 98–111)
Creatinine, Ser: 0.68 mg/dL (ref 0.44–1.00)
GFR, Estimated: >60 mL/min (ref >60)
Glucose, Bld: 244 mg/dL — ABNORMAL HIGH (ref 70–99)
Potassium: 4.1 mmol/L (ref 3.5–5.1)
Sodium: 135 mmol/L (ref 135–145)

## 2020-04-02 LAB — SARS CORONAVIRUS 2 (TAT 6-24 HRS): SARS Coronavirus 2: NEGATIVE

## 2020-04-05 MED ORDER — SODIUM CHLORIDE 0.9 % IV SOLN: INTRAVENOUS | Status: DC

## 2020-04-05 MED ORDER — DEXTROSE 5 % IV SOLN
3.0000 g | INTRAVENOUS | Status: AC
  Administered 2020-04-06: 3 g via INTRAVENOUS
  Filled 2020-04-05: qty 3

## 2020-04-05 MED ORDER — CHLORHEXIDINE GLUCONATE 0.12 % MT SOLN
15.0000 mL | Freq: Once | OROMUCOSAL | Status: AC
  Administered 2020-04-06: 15 mL via OROMUCOSAL

## 2020-04-05 MED ORDER — ORAL CARE MOUTH RINSE: 15.0000 mL | Freq: Once | OROMUCOSAL | Status: AC

## 2020-04-06 ENCOUNTER — Ambulatory Visit: Payer: No Typology Code available for payment source | Admitting: Urgent Care

## 2020-04-06 ENCOUNTER — Encounter: Payer: Self-pay | Admitting: Orthopedic Surgery

## 2020-04-06 ENCOUNTER — Other Ambulatory Visit: Payer: Self-pay

## 2020-04-06 ENCOUNTER — Ambulatory Visit: Payer: No Typology Code available for payment source

## 2020-04-06 ENCOUNTER — Encounter
Admission: RE | Disposition: A | Discharge status: Home / Self Care | Payer: Self-pay | Source: Home / Self Care | Attending: Orthopedic Surgery

## 2020-04-06 ENCOUNTER — Ambulatory Visit
Admission: RE | Admit: 2020-04-06 | Discharge: 2020-04-06 | Disposition: A | Discharge status: Home / Self Care | Payer: No Typology Code available for payment source | Attending: Orthopedic Surgery | Admitting: Orthopedic Surgery

## 2020-04-06 DIAGNOSIS — M75121 Complete rotator cuff tear or rupture of right shoulder, not specified as traumatic: Secondary | ICD-10-CM | POA: Insufficient documentation

## 2020-04-06 DIAGNOSIS — Z8249 Family history of ischemic heart disease and other diseases of the circulatory system: Secondary | ICD-10-CM | POA: Insufficient documentation

## 2020-04-06 DIAGNOSIS — M7581 Other shoulder lesions, right shoulder: Secondary | ICD-10-CM | POA: Insufficient documentation

## 2020-04-06 DIAGNOSIS — Z79899 Other long term (current) drug therapy: Secondary | ICD-10-CM | POA: Diagnosis not present

## 2020-04-06 DIAGNOSIS — Z419 Encounter for procedure for purposes other than remedying health state, unspecified: Secondary | ICD-10-CM

## 2020-04-06 DIAGNOSIS — Z8261 Family history of arthritis: Secondary | ICD-10-CM | POA: Diagnosis not present

## 2020-04-06 DIAGNOSIS — M75111 Incomplete rotator cuff tear or rupture of right shoulder, not specified as traumatic: Secondary | ICD-10-CM | POA: Diagnosis present

## 2020-04-06 DIAGNOSIS — M19011 Primary osteoarthritis, right shoulder: Secondary | ICD-10-CM | POA: Diagnosis not present

## 2020-04-06 DIAGNOSIS — Z833 Family history of diabetes mellitus: Secondary | ICD-10-CM | POA: Insufficient documentation

## 2020-04-06 DIAGNOSIS — E119 Type 2 diabetes mellitus without complications: Secondary | ICD-10-CM | POA: Diagnosis not present

## 2020-04-06 DIAGNOSIS — M7541 Impingement syndrome of right shoulder: Secondary | ICD-10-CM | POA: Diagnosis not present

## 2020-04-06 HISTORY — PX: SHOULDER ARTHROSCOPY WITH SUBACROMIAL DECOMPRESSION AND OPEN ROTATOR C: SHX5688

## 2020-04-06 LAB — GLUCOSE, CAPILLARY
Glucose-Capillary: 234 mg/dL — ABNORMAL HIGH (ref 70–99)
Glucose-Capillary: 171 mg/dL — ABNORMAL HIGH (ref 70–99)

## 2020-04-06 LAB — POCT PREGNANCY, URINE: Preg Test, Ur: NEGATIVE

## 2020-04-06 SURGERY — SHOULDER ARTHROSCOPY WITH SUBACROMIAL DECOMPRESSION AND OPEN ROTATOR CUFF REPAIR, OPEN BICEPS TENDON REPAIR
Anesthesia: General | Laterality: Right

## 2020-04-06 MED ORDER — DEXAMETHASONE SODIUM PHOSPHATE 10 MG/ML IJ SOLN
INTRAMUSCULAR | Status: DC | PRN
  Administered 2020-04-06: 10 mg via INTRAVENOUS

## 2020-04-06 MED ORDER — ONDANSETRON HCL 4 MG/2ML IJ SOLN
INTRAMUSCULAR | Status: DC | PRN
  Administered 2020-04-06: 4 mg via INTRAVENOUS

## 2020-04-06 MED ORDER — SUCCINYLCHOLINE CHLORIDE 20 MG/ML IJ SOLN
INTRAMUSCULAR | Status: DC | PRN
  Administered 2020-04-06: 120 mg via INTRAVENOUS

## 2020-04-06 MED ORDER — INSULIN ASPART 100 UNIT/ML ~~LOC~~ SOLN
SUBCUTANEOUS | Status: AC
  Filled 2020-04-06: qty 1

## 2020-04-06 MED ORDER — INSULIN ASPART 100 UNIT/ML ~~LOC~~ SOLN
3.0000 [IU] | Freq: Once | SUBCUTANEOUS | Status: AC
  Administered 2020-04-06: 3 [IU] via SUBCUTANEOUS

## 2020-04-06 MED ORDER — BUPIVACAINE LIPOSOME 1.3 % IJ SUSP
INTRAMUSCULAR | Status: AC
  Filled 2020-04-06: qty 20

## 2020-04-06 MED ORDER — LACTATED RINGERS IV SOLN: INTRAVENOUS | Status: DC | PRN

## 2020-04-06 MED ORDER — BUPIVACAINE HCL (PF) 0.5 % IJ SOLN
INTRAMUSCULAR | Status: AC
  Filled 2020-04-06: qty 10

## 2020-04-06 MED ORDER — MIDAZOLAM HCL 2 MG/2ML IJ SOLN: 1.0000 mg | Freq: Once | INTRAMUSCULAR | Status: AC

## 2020-04-06 MED ORDER — MIDAZOLAM HCL 2 MG/2ML IJ SOLN
1.0000 mg | Freq: Once | INTRAMUSCULAR | Status: AC
  Administered 2020-04-06: 1 mg via INTRAVENOUS

## 2020-04-06 MED ORDER — MIDAZOLAM HCL 2 MG/2ML IJ SOLN
INTRAMUSCULAR | Status: AC
  Administered 2020-04-06: 1 mg via INTRAVENOUS
  Filled 2020-04-06: qty 2

## 2020-04-06 MED ORDER — LIDOCAINE HCL (PF) 1 % IJ SOLN
INTRAMUSCULAR | Status: AC
  Filled 2020-04-06: qty 5

## 2020-04-06 MED ORDER — CHLORHEXIDINE GLUCONATE 0.12 % MT SOLN
OROMUCOSAL | Status: AC
  Filled 2020-04-06: qty 15

## 2020-04-06 MED ORDER — BUPIVACAINE HCL (PF) 0.5 % IJ SOLN
INTRAMUSCULAR | Status: DC | PRN
  Administered 2020-04-06: 10 mL

## 2020-04-06 MED ORDER — SUGAMMADEX SODIUM 200 MG/2ML IV SOLN
INTRAVENOUS | Status: DC | PRN
  Administered 2020-04-06: 200 mg via INTRAVENOUS

## 2020-04-06 MED ORDER — OXYCODONE HCL 5 MG PO TABS
ORAL_TABLET | ORAL | Status: AC
  Administered 2020-04-06: 5 mg via ORAL
  Filled 2020-04-06: qty 1

## 2020-04-06 MED ORDER — BUPIVACAINE LIPOSOME 1.3 % IJ SUSP
INTRAMUSCULAR | Status: DC | PRN
  Administered 2020-04-06: 20 mL

## 2020-04-06 MED ORDER — ROCURONIUM BROMIDE 100 MG/10ML IV SOLN
INTRAVENOUS | Status: DC | PRN
  Administered 2020-04-06: 30 mg via INTRAVENOUS
  Administered 2020-04-06: 20 mg via INTRAVENOUS
  Administered 2020-04-06: 10 mg via INTRAVENOUS

## 2020-04-06 MED ORDER — PHENYLEPHRINE HCL (PRESSORS) 10 MG/ML IV SOLN
INTRAVENOUS | Status: DC | PRN
  Administered 2020-04-06: 200 ug via INTRAVENOUS
  Administered 2020-04-06: 100 ug via INTRAVENOUS

## 2020-04-06 MED ORDER — OXYCODONE HCL 5 MG PO TABS: 5.0000 mg | ORAL_TABLET | Freq: Once | ORAL | Status: AC

## 2020-04-06 MED ORDER — PROPOFOL 10 MG/ML IV BOLUS
INTRAVENOUS | Status: AC
  Filled 2020-04-06: qty 20

## 2020-04-06 MED ORDER — ACETAMINOPHEN 500 MG PO TABS: 1000.0000 mg | ORAL_TABLET | Freq: Three times a day (TID) | ORAL | 2 refills | Status: AC

## 2020-04-06 MED ORDER — PROPOFOL 10 MG/ML IV BOLUS
INTRAVENOUS | Status: DC | PRN
  Administered 2020-04-06: 200 mg via INTRAVENOUS

## 2020-04-06 MED ORDER — ASPIRIN EC 325 MG PO TBEC: 325.0000 mg | DELAYED_RELEASE_TABLET | Freq: Every day | ORAL | 0 refills | Status: AC

## 2020-04-06 MED ORDER — LIDOCAINE HCL (CARDIAC) PF 100 MG/5ML IV SOSY
PREFILLED_SYRINGE | INTRAVENOUS | Status: DC | PRN
  Administered 2020-04-06: 60 mg via INTRAVENOUS

## 2020-04-06 MED ORDER — OXYCODONE HCL 5 MG PO TABS: 5.0000 mg | ORAL_TABLET | ORAL | 0 refills | Status: AC | PRN

## 2020-04-06 MED ORDER — FENTANYL CITRATE (PF) 100 MCG/2ML IJ SOLN
INTRAMUSCULAR | Status: AC
  Administered 2020-04-06: 50 ug via INTRAVENOUS
  Filled 2020-04-06: qty 2

## 2020-04-06 MED ORDER — FENTANYL CITRATE (PF) 100 MCG/2ML IJ SOLN: 50.0000 ug | Freq: Once | INTRAMUSCULAR | Status: AC

## 2020-04-06 MED ORDER — SEVOFLURANE IN SOLN
RESPIRATORY_TRACT | Status: AC
  Filled 2020-04-06: qty 250

## 2020-04-06 MED ORDER — ONDANSETRON 4 MG PO TBDP: 4.0000 mg | ORAL_TABLET | Freq: Three times a day (TID) | ORAL | 0 refills | Status: AC | PRN

## 2020-04-06 SURGICAL SUPPLY — 83 items
ADAPTER IRRIG TUBE 2 SPIKE SOL (ADAPTER) IMPLANT
ADH SKN CLS APL DERMABOND .7 (GAUZE/BANDAGES/DRESSINGS)
ADPR TBG 2 SPK PMP STRL ASCP (ADAPTER)
ANCH SUT BN ASCP DLV (Anchor) ×1 IMPLANT
ANCH SUT RGNRT REGENETEN (Staple) ×1 IMPLANT
ANCH SUT SHRT 12.5 CANN EYLT (Anchor) ×1 IMPLANT
ANCHOR BONE REGENETEN (Anchor) ×2 IMPLANT
ANCHOR SUT BIOCOMP LK 2.9X12.5 (Anchor) ×2 IMPLANT
ANCHOR TENDON REGENETEN (Staple) ×2 IMPLANT
APL PRP STRL LF DISP 70% ISPRP (MISCELLANEOUS) ×1
BNDG ADH 2 X3.75 FABRIC TAN LF (GAUZE/BANDAGES/DRESSINGS) ×2 IMPLANT
BNDG ADH XL 3.75X2 STRCH LF (GAUZE/BANDAGES/DRESSINGS) ×1
BUR BR 5.5 12 FLUTE (BURR) ×2 IMPLANT
BUR RADIUS 4.0X18.5 (BURR) ×2 IMPLANT
CANNULA PART THRD DISP 5.75X7 (CANNULA) IMPLANT
CANNULA PARTIAL THREAD 2X7 (CANNULA) IMPLANT
CANNULA TWIST IN 8.25X9CM (CANNULA) IMPLANT
CHLORAPREP W/TINT 26 (MISCELLANEOUS) ×2 IMPLANT
COOLER POLAR GLACIER W/PUMP (MISCELLANEOUS) ×2 IMPLANT
COVER WAND RF STERILE (DRAPES) ×2 IMPLANT
DERMABOND ADVANCED (GAUZE/BANDAGES/DRESSINGS)
DERMABOND ADVANCED .7 DNX12 (GAUZE/BANDAGES/DRESSINGS) IMPLANT
DRAPE 3/4 80X56 (DRAPES) ×2 IMPLANT
DRAPE IMP U-DRAPE 54X76 (DRAPES) ×4 IMPLANT
DRAPE INCISE IOBAN 66X45 STRL (DRAPES) ×2 IMPLANT
DRAPE U-SHAPE 47X51 STRL (DRAPES) ×4 IMPLANT
DRSG TEGADERM 4X4.75 (GAUZE/BANDAGES/DRESSINGS) ×6 IMPLANT
ELECT REM PT RETURN 9FT ADLT (ELECTROSURGICAL) ×2
ELECTRODE REM PT RTRN 9FT ADLT (ELECTROSURGICAL) ×1 IMPLANT
GAUZE SPONGE 4X4 12PLY STRL (GAUZE/BANDAGES/DRESSINGS) ×2 IMPLANT
GAUZE XEROFORM 1X8 LF (GAUZE/BANDAGES/DRESSINGS) ×2 IMPLANT
GLOVE SRG 8 PF TXTR STRL LF DI (GLOVE) ×2 IMPLANT
GLOVE SURG ENC MOIS LTX SZ7.5 (GLOVE) ×2 IMPLANT
GLOVE SURG ORTHO LTX SZ8 (GLOVE) ×2 IMPLANT
GLOVE SURG SYN 8.0 (GLOVE) ×2 IMPLANT
GLOVE SURG UNDER POLY LF SZ8 (GLOVE) ×4
GOWN STRL REUS W/ TWL LRG LVL3 (GOWN DISPOSABLE) ×2 IMPLANT
GOWN STRL REUS W/TWL LRG LVL3 (GOWN DISPOSABLE) ×4
GOWN STRL REUS W/TWL XL LVL4 (GOWN DISPOSABLE) ×2 IMPLANT
IMPL REGENETEN MEDIUM (Shoulder) ×1 IMPLANT
IMPLANT REGENETEN MEDIUM (Shoulder) ×2 IMPLANT
IV LACTATED RINGER IRRG 3000ML (IV SOLUTION) ×14
IV LR IRRIG 3000ML ARTHROMATIC (IV SOLUTION) ×7 IMPLANT
KIT CORKSCREW KNTLS 3.9 S/T/P (INSTRUMENTS) IMPLANT
KIT STABILIZATION SHOULDER (MISCELLANEOUS) ×2 IMPLANT
KIT SUTURETAK 3.0 INSERT PERC (KITS) ×2 IMPLANT
KIT TURNOVER KIT A (KITS) ×2 IMPLANT
MANIFOLD NEPTUNE II (INSTRUMENTS) ×4 IMPLANT
MASK FACE SPIDER DISP (MASK) ×2 IMPLANT
MAT ABSORB  FLUID 56X50 GRAY (MISCELLANEOUS) ×2
MAT ABSORB FLUID 56X50 GRAY (MISCELLANEOUS) ×2 IMPLANT
NDL MAYO CATGUT SZ5 (NEEDLE)
NDL SAFETY ECLIPSE 18X1.5 (NEEDLE) ×1 IMPLANT
NDL SUT 5 .5 CRC TPR PNT MAYO (NEEDLE) IMPLANT
NEEDLE HYPO 18GX1.5 SHARP (NEEDLE) ×2
NEEDLE SCORPION MULTI FIRE (NEEDLE) IMPLANT
PACK ARTHROSCOPY SHOULDER (MISCELLANEOUS) ×2 IMPLANT
PAD ARMBOARD 7.5X6 YLW CONV (MISCELLANEOUS) ×2 IMPLANT
PAD WRAPON POLAR SHDR XLG (MISCELLANEOUS) ×1 IMPLANT
PASSER SUT FIRSTPASS SELF (INSTRUMENTS) ×2 IMPLANT
PASSER SUT SWIFTSTITCH HIP CRT (INSTRUMENTS) ×2 IMPLANT
PENCIL SMOKE EVACUATOR (MISCELLANEOUS) ×2 IMPLANT
SET TUBE SUCT SHAVER OUTFL 24K (TUBING) ×2 IMPLANT
SET TUBE TIP INTRA-ARTICULAR (MISCELLANEOUS) ×2 IMPLANT
SLING ULTRA II M (MISCELLANEOUS) ×2 IMPLANT
STAPLER SKIN PROX 35W (STAPLE) IMPLANT
STRAP SAFETY 5IN WIDE (MISCELLANEOUS) ×2 IMPLANT
SUT ETHILON 3-0 (SUTURE) ×4 IMPLANT
SUT LASSO 90 DEG CVD (SUTURE) IMPLANT
SUT LASSO 90 DEG SD STR (SUTURE) IMPLANT
SUT MNCRL 4-0 (SUTURE)
SUT MNCRL 4-0 27XMFL (SUTURE)
SUT PROLENE 0 CT 2 (SUTURE) IMPLANT
SUT VIC AB 0 CT1 36 (SUTURE) IMPLANT
SUT VIC AB 2-0 CT2 27 (SUTURE) IMPLANT
SUTURE MNCRL 4-0 27XMF (SUTURE) IMPLANT
TAPE CLOTH 3X10 WHT NS LF (GAUZE/BANDAGES/DRESSINGS) ×2 IMPLANT
TAPE MICROFOAM 4IN (TAPE) ×2 IMPLANT
TUBING ARTHRO INFLOW-ONLY STRL (TUBING) ×2 IMPLANT
TUBING CONNECTING 10 (TUBING) IMPLANT
WAND WEREWOLF FLOW 90D (MISCELLANEOUS) ×2 IMPLANT
WRAPON POLAR PAD SHDR XLG (MISCELLANEOUS) ×2
c7110 tubing set gravity 2 spike ×2 IMPLANT

## 2020-04-06 NOTE — Anesthesia Procedure Notes (Signed)
Anesthesia Regional Block: Interscalene brachial plexus block   Pre-Anesthetic Checklist: ,, timeout performed, Correct Patient, Correct Site, Correct Laterality, Correct Procedure, Correct Position, site marked, Risks and benefits discussed,  Surgical consent,  Pre-op evaluation,  At surgeon's request and post-op pain management  Laterality: Right  Prep: chloraprep, alcohol swabs       Needles:  Injection technique: Single-shot  Needle Type: Stimiplex     Needle Length: 5cm  Needle Gauge: 22     Additional Needles:   Procedures:, nerve stimulator,,, ultrasound used (permanent image in chart),,,,   Nerve Stimulator or Paresthesia:  Response: biceps flexion, 0.6 mA,   Additional Responses:   Narrative:  Start time: 04/06/2020 10:31 AM End time: 04/06/2020 10:35 AM Injection made incrementally with aspirations every 5 mL.  Performed by: Personally  Anesthesiologist: Yves Dill, MD  Additional Notes: Functioning IV was confirmed and monitors were applied.  A 82mm 22ga Stimuplex needle was used. Sterile prep and drape,hand hygiene and sterile gloves were used.  Negative aspiration and negative test dose prior to incremental administration of local anesthetic. The patient tolerated the procedure well.  Initially had some vagal response to skin wheal , but resolved.  Easy injection with no pain on injection.

## 2020-04-06 NOTE — Transfer of Care (Addendum)
Immediate Anesthesia Transfer of Care Note  Patient: Brenda Hancock  Procedure(s) Performed: Right shoulder arthroscopic , subacromial decompression, distal clavicle excision, and biceps tenodesis - Dedra Skeens to Assist (Right )  Patient Location: PACU  Anesthesia Type:General  Level of Consciousness: awake, alert  and oriented  Airway & Oxygen Therapy: Patient Spontanous Breathing and Patient connected to face mask oxygen  Post-op Assessment: Report given to RN and Post -op Vital signs reviewed and stable  Post vital signs: Reviewed and stable  Last Vitals:  Vitals Value Taken Time  BP 158/76 04/06/20 1330  Temp    Pulse 100 04/06/20 1331  Resp 22 04/06/20 1331  SpO2 90 % 04/06/20 1331  Vitals shown include unvalidated device data.  Last Pain:  Vitals:   04/06/20 0940  TempSrc: Temporal  PainSc: 0-No pain         Complications: No complications documented.

## 2020-04-06 NOTE — Op Note (Signed)
SURGERY DATE: 04/06/2020   PRE-OP DIAGNOSIS:  1. Right partial thickness rotator cuff tear 2. Right biceps tendinopathy 3. Right subacromial impingement 4. Right acromioclavicular joint arthritis  POST-OP DIAGNOSIS: 1. Right partial thickness and full-thickness rotator cuff tear 2. Right biceps tendinopathy 3. Right subacromial impingement 4. Right acromioclavicular joint arthritis  PROCEDURES:  1. Right arthroscopic rotator cuff repair via Regeneten patch application 2. Right arthroscopic biceps tenodesis 3. Right extensive debridement of shoulder (glenohumeral and subacromial spaces) 4. Right arthroscopic distal clavicle excision 5. Right arthroscopic subacromial decompression  SURGEON: Rosealee Albee, MD  ASSISTANT: Sonny Dandy, PA  ANESTHESIA: Gen with interscalene block w/Exparil  ESTIMATED BLOOD LOSS: 5cc  DRAINS:  none  TOTAL IV FLUIDS: per anesthesia   SPECIMENS: none  IMPLANTS:  - [employer] Regeneten patch (size medium) with associated tendon and bone staples - Arthrex 2.65mm PushLock anchor (arthroscopic biceps tenodesis)  OPERATIVE FINDINGS:  Examination under anesthesia: A careful examination under anesthesia was performed.  Passive range of motion was: FF: 150; ER at side: 60; ER in abduction: 90; IR in abduction: 45.  Anterior load shift: NT.  Posterior load shift: NT.  Sulcus in neutral: NT.  Sulcus in ER: NT.    Intra-operative findings: A thorough arthroscopic examination of the shoulder was performed.  The findings are: 1. Biceps tendon: Significant tendinopathy with erythema  2. Superior labrum: Degenerative 3. Posterior labrum and capsule: normal 4. Inferior capsule and inferior recess: normal 5. Glenoid cartilage surface: Normal 6. Supraspinatus attachment: partial thickness tearing of anterior supraspinatus involving approximately 30% of the articular surface.  Additionally there was bursal sided tearing in this vicinity affecting  approximately 10-20% of the bursal sided supraspinatus 7. Posterior rotator cuff attachment: normal  8. Humeral head articular cartilage: normal 9. Rotator interval: Synovitic 10: Subscapularis tendon: Normal 11. Anterior labrum: degenerative tearing 12. IGHL: normal  OPERATIVE REPORT:   Indications for procedure: Brenda Hancock is a 55 y.o. female with ~9 months of shoulder pain that began when she tripped over a bench at work.  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  She had immediate pain after this incident.  She was treated nonoperatively with medications, activity modifications, physical therapy, and corticosteroid injection without complete improvement in her symptoms.  Clinical exam and MRI were significant for partial thickness versus full-thickness rotator cuff tear, subacromial impingement/bursitis, AC joint arthritis, and biceps tendinopathy. After discussion of risks, benefits, and alternatives to surgery, the patient elected to proceed with above mentioned procedure. The patient understands that use of the Regeneten patch is relatively new and long-term data is unknown.  Procedure in detail:  I identified Trenda Moots in the pre-operative holding area.  I marked the operative shoulder with my initials. I reviewed the risks and benefits of the proposed surgical intervention, and the patient wished to proceed.  Anesthesia was then performed with an interscalene block with Exparil.  The patient was transferred to the operative suite and placed in the beach chair position.    SCDs were placed on the lower extremities. Appropriate IV antibiotics were administered. The operative upper extremity was then prepped and draped in standard fashion. A time out was performed confirming the correct extremity, correct patient and correct procedure.   I then created a standard posterior portal with an 11 blade. The glenohumeral joint was easily entered with a blunt trochar and the  arthroscope introduced. The findings of diagnostic arthroscopy are described above. I debrided the degenerative anterior labrum, superior  labrum, and also debrided and coagulated the inflamed synovium to obtain hemostasis and reduce the risk of post-operative swelling using an Arthrocare radiofrequency device.    I then turned my attention to the arthroscopic biceps tenodesis.  I used the loop n tack technique to pass a fiber tape through the biceps in a locked fashion adjacent to the biceps anchor.  The biceps tendon was cut at its attachment on the superior labrum.  A hole for a 2.9 mm Arthrex PushLock was drilled in the bicipital groove just superior to the subscapularis tendon insertion.  The fiber tape was loaded onto the push lock anchor and impacted into place into the previously drilled hole in the bicipital groove.  This appropriately secured the biceps into the bicipital groove and took it off of tension.  An oscillating shaver was then used to debride the frayed portion of the anterior aspect of the supraspinatus in the area of the partial-thickness tear on the articular side.  A spinal needle was placed in the central portion of the partial thickness rotator cuff tear and an 0-PDS suture was passed through the needle and retrieved out of the anterior portal to mark this region.  Next, the arthroscope was then introduced into the subacromial space. A direct lateral portal was created with an 11-blade after spinal needle localization. An extensive subacromial bursectomy was performed using a combination of the shaver and Arthrocare wand. The entire acromial undersurface was exposed and the CA ligament was subperiosteally elevated to expose the prominent anterior acromial hook. A burr was used to create a flat anterior and lateral aspect of the acromion, converting it from a Type 2 to a Type 1 acromion. Care was made to keep the deltoid fascia intact.  Then, I exposed the acromioclavicular joint  using a combination of shaver and arthrocare wand. The distal 18mm of clavicle was removed using a 5.70mm burr (2 burr widths removed). Adequate resection was confirmed by placing the camera into the anterior portal and by using a probe to measure the distance between the acromion and distal clavicle. Care was taken to preserve the superior and posterior capsule.   Next, a switching stick was used to probe the bursal side of the rotator cuff in the region of the previously passed 0 PDS suture.  There was no full-thickness tear as the switching stick could not be pushed through into the glenohumeral joint.  This confirmed appropriate partial-thickness nature of the tear. Given this, we decided to proceed with Regeneten patch placement. The Regeneten patch delivery gun was placed appropriately and the patch was delivered over the supraspinatus tendon using the PDS suture as a reference. It was positioned such the medial border of the patch was just medial to the PDS suture. Tendon staples were placed medially, anteriorly, and posteriorly after making appropriate stab incisions for portal placement. Two bone staples were then placed laterally. The patch was then probed to confirm appropriate stability.   Arthroscopic fluid was evacuated from the joint.  The portals were closed with 3-0 Nylon. Xeroform was applied to the portals. A sterile dressing was applied, followed by a Polar Care sleeve and a SlingShot shoulder immobilizer/sling. The patient awoke from anesthesia without difficulty and was transferred to the PACU in stable condition.    Of note, assistance from a PA was essential to performing the surgery.  PA was present for the entire surgery.  PA assisted with patient positioning, retraction, instrumentation, and wound closure. The surgery would have been more difficult  and had longer operative time without PA assistance.   COMPLICATIONS: none  DISPOSITION: plan for discharge home after recovery in  PACU  POSTOPERATIVE PLAN: Remain in sling (except hygiene and elbow/wrist/hand RoM exercises as instructed by PT) x 2 weeks and NWB for this time. Wean from sling as tolerated after 2 weeks.  PT to begin 3-4 days after surgery. Use Regeneten Patch rehab protocol: For the first 4 weeks, forward flexion is limited to 100. External rotation with the arm by the side is allowed, but abduction-external rotation is not allowed for the first 6 weeks. After 6 weeks, no restrictions on motion or arm use.

## 2020-04-06 NOTE — Anesthesia Procedure Notes (Signed)
Procedure Name: Intubation Date/Time: 04/06/2020 11:13 AM Performed by: Danelle Berry, CRNA Pre-anesthesia Checklist: Patient identified, Emergency Drugs available, Suction available and Patient being monitored Patient Re-evaluated:Patient Re-evaluated prior to induction Oxygen Delivery Method: Circle system utilized Preoxygenation: Pre-oxygenation with 100% oxygen Induction Type: IV induction Ventilation: Mask ventilation without difficulty Laryngoscope Size: McGraph and 3 Tube type: Oral Tube size: 7.0 mm Number of attempts: 1 Airway Equipment and Method: Stylet and Oral airway Placement Confirmation: ETT inserted through vocal cords under direct vision,  positive ETCO2 and breath sounds checked- equal and bilateral Tube secured with: Tape Dental Injury: Teeth and Oropharynx as per pre-operative assessment

## 2020-04-06 NOTE — Discharge Instructions (Signed)
Post-Op Instructions - Regeneten Patch  1. Bracing: You will wear a shoulder immobilizer or sling for 2 weeks.   2. Driving: No driving for at least 2 weeks post-op.   3. Activity: Progress to motion as tolerated, moving from passive to active-assisted to active motion. For the first 4 weeks, forward flexion is limited to 100. External rotation with the arm by the side is allowed, but abduction-external rotation is not allowed for the first 6 weeks. After 6 weeks, no restrictions on motion or arm use. Return to normal activities normally takes 4-6 months on average. If rehab goes very well, may be able to do most activities at 4 months, except overhead or contact sports.  4. Physical Therapy: Begins 2-4 days after surgery  5. Medications:  - You will be provided a prescription for narcotic pain medicine. After surgery, take 1-2 narcotic tablets every 4 hours if needed for severe pain.  - A prescription for anti-nausea medication will be provided in case the narcotic medicine causes nausea - take 1 tablet every 6 hours only if nauseated.   - Take tylenol 1000 mg (2 Extra Strength tablets or 3 regular strength) every 8 hours for pain.  May decrease or stop tylenol 5 days after surgery if you are having minimal pain. - Take ASA 325mg /day x 2 weeks to help prevent DVTs/PEs (blood clots).  - DO NOT take ANY nonsteroidal anti-inflammatory pain medications (Advil, Motrin, Ibuprofen, Aleve, Naproxen, or Naprosyn). These medicines can inhibit healing of your shoulder repair.    If you are taking prescription medication for anxiety, depression, insomnia, muscle spasm, chronic pain, or for attention deficit disorder, you are advised that you are at a higher risk of adverse effects with use of narcotics post-op, including narcotic addiction/dependence, depressed breathing, death. If you use non-prescribed substances: alcohol, marijuana, cocaine, heroin, methamphetamines, etc., you are at a higher risk of  adverse effects with use of narcotics post-op, including narcotic addiction/dependence, depressed breathing, death. You are advised that taking > 50 morphine milligram equivalents (MME) of narcotic pain medication per day results in twice the risk of overdose or death. For your prescription provided: oxycodone 5 mg - taking more than 6 tablets per day would result in > 50 morphine milligram equivalents (MME) of narcotic pain medication. Be advised that we will prescribe narcotics short-term, for acute post-operative pain only - 3 weeks for major operations such as shoulder repair/reconstruction surgeries.    6. Post-Op Appointment:  Your first post-op appointment will be 10-14 days post-op.  7. Work or School: For most, but not all procedures, we advise staying out of work or school for at least 1 to 2 weeks in order to recover from the stress of surgery and to allow time for healing.   If you need a work or school note this can be provided.   8. Smoking: If you are a smoker, you need to refrain from smoking in the postoperative period. The nicotine in cigarettes will inhibit healing of your shoulder repair and decrease the chance of successful repair. Similarly, nicotine containing products (gum, patches) should be avoided.   Post-operative Brace: Apply and remove the brace you received as you were instructed to at the time of fitting and as described in detail as the braces instructions for use indicate.  Wear the brace for the period of time prescribed by your physician.  The brace can be cleaned with soap and water and allowed to air dry only.  Should the brace result  in increased pain, decreased feeling (numbness/tingling), increased swelling or an overall worsening of your medical condition, please contact your doctor immediately.  If an emergency situation occurs as a result of wearing the brace after normal business hours, please dial 911 and seek immediate medical attention.  Let your  doctor know if you have any further questions about the brace issued to you. Refer to the shoulder sling instructions for use if you have any questions regarding the correct fit of your shoulder sling.  [facility] Customer Care for Troubleshooting: [phone]  Video that illustrates how to properly use a shoulder sling: "Instructions for Proper Use of an Orthopaedic Sling" http://bass.com/

## 2020-04-06 NOTE — Progress Notes (Signed)
Pt became diaphoretic and nauseated during block in pre-op. VS stable, pt in NAD . Dr. Noralyn Pick at bedside

## 2020-04-06 NOTE — H&P (Signed)
Paper H&P to be scanned into permanent record. H&P reviewed. No significant changes noted.  

## 2020-04-06 NOTE — Anesthesia Preprocedure Evaluation (Addendum)
Anesthesia Evaluation  Patient identified by MRN, date of birth, ID band Patient awake    Reviewed: Allergy & Precautions, NPO status , Patient's Chart, lab work & pertinent test results  History of Anesthesia Complications Negative for: history of anesthetic complications  Airway Mallampati: II  TM Distance: >3 FB Neck ROM: Full    Dental no notable dental hx.    Pulmonary neg pulmonary ROS, neg sleep apnea, neg COPD,    breath sounds clear to auscultation- rhonchi (-) wheezing      Cardiovascular hypertension, Pt. on medications (-) CAD, (-) Past MI, (-) Cardiac Stents and (-) CABG  Rhythm:Regular Rate:Normal - Systolic murmurs and - Diastolic murmurs    Neuro/Psych neg Seizures PSYCHIATRIC DISORDERS Anxiety Depression negative neurological ROS     GI/Hepatic Neg liver ROS, GERD  ,  Endo/Other  diabetes, Oral Hypoglycemic AgentsMorbid obesity  Renal/GU negative Renal ROS     Musculoskeletal  (+) Arthritis ,   Abdominal (+) + obese,   Peds  Hematology negative hematology ROS (+)   Anesthesia Other Findings Past Medical History: No date: Anxiety No date: Depression No date: Diabetes mellitus without complication (HCC) No date: Hyperlipidemia No date: Hypertension   Reproductive/Obstetrics                             Anesthesia Physical  Anesthesia Plan  ASA: III  Anesthesia Plan: General   Post-op Pain Management:  Regional for Post-op pain   Induction: Intravenous  PONV Risk Score and Plan:   Airway Management Planned: Oral ETT  Additional Equipment:   Intra-op Plan:   Post-operative Plan: Extubation in OR  Informed Consent: I have reviewed the patients History and Physical, chart, labs and discussed the procedure including the risks, benefits and alternatives for the proposed anesthesia with the patient or authorized representative who has indicated his/her  understanding and acceptance.     Dental advisory given  Plan Discussed with: CRNA and Anesthesiologist  Anesthesia Plan Comments: (Surgeon requested interscalene block for postop pain control.  The risks were discussed with the patient which included cardiac arrest, pneumothorax, infection, seizures, nerve damage, and block not working well.  Patient understands and accepts risks.)       Anesthesia Quick Evaluation

## 2020-04-07 ENCOUNTER — Encounter: Payer: Self-pay | Admitting: Orthopedic Surgery

## 2020-04-14 NOTE — Anesthesia Postprocedure Evaluation (Signed)
Anesthesia Post Note  Patient: Brenda Hancock  Procedure(s) Performed: Right shoulder arthroscopic , subacromial decompression, distal clavicle excision, and biceps tenodesis - Dedra Skeens to Assist (Right )  Patient location during evaluation: PACU Anesthesia Type: General Level of consciousness: awake and alert and oriented Pain management: pain level controlled Vital Signs Assessment: post-procedure vital signs reviewed and stable Respiratory status: spontaneous breathing Cardiovascular status: blood pressure returned to baseline Anesthetic complications: no   No complications documented.   Last Vitals:  Vitals:   04/06/20 1415 04/06/20 1501  BP: 138/78 (!) 145/90  Pulse: 93 86  Resp: 18 16  Temp: 36.6 C (!) 36.3 C  SpO2: 93% 99%    Last Pain:  Vitals:   04/07/20 0836  TempSrc:   PainSc: 2                  Yanette Tripoli

## 2020-08-28 ENCOUNTER — Other Ambulatory Visit: Payer: Self-pay | Admitting: Obstetrics and Gynecology

## 2020-09-22 ENCOUNTER — Other Ambulatory Visit: Payer: Self-pay | Admitting: Family Medicine

## 2020-09-22 DIAGNOSIS — Z1231 Encounter for screening mammogram for malignant neoplasm of breast: Secondary | ICD-10-CM

## 2020-10-08 ENCOUNTER — Other Ambulatory Visit: Payer: Self-pay

## 2020-10-08 ENCOUNTER — Ambulatory Visit
Admission: RE | Admit: 2020-10-08 | Discharge: 2020-10-08 | Disposition: A | Discharge status: Home / Self Care | Payer: BC Managed Care – PPO | Source: Ambulatory Visit | Attending: Family Medicine | Admitting: Family Medicine

## 2020-10-08 DIAGNOSIS — Z1231 Encounter for screening mammogram for malignant neoplasm of breast: Secondary | ICD-10-CM | POA: Insufficient documentation

## 2020-12-20 IMAGING — MG DIGITAL SCREENING BILAT W/ TOMO W/ CAD
8 of 14 series · 8 of 40 positions shown · non-contrast
Comparison: Previous exam(s).

ACR Breast Density Category a: The breast tissue is almost entirely
fatty.

CLINICAL DATA: Screening.

EXAM:
DIGITAL SCREENING BILATERAL MAMMOGRAM WITH TOMO AND CAD

[L MLO synth-2D (1 of 2)]
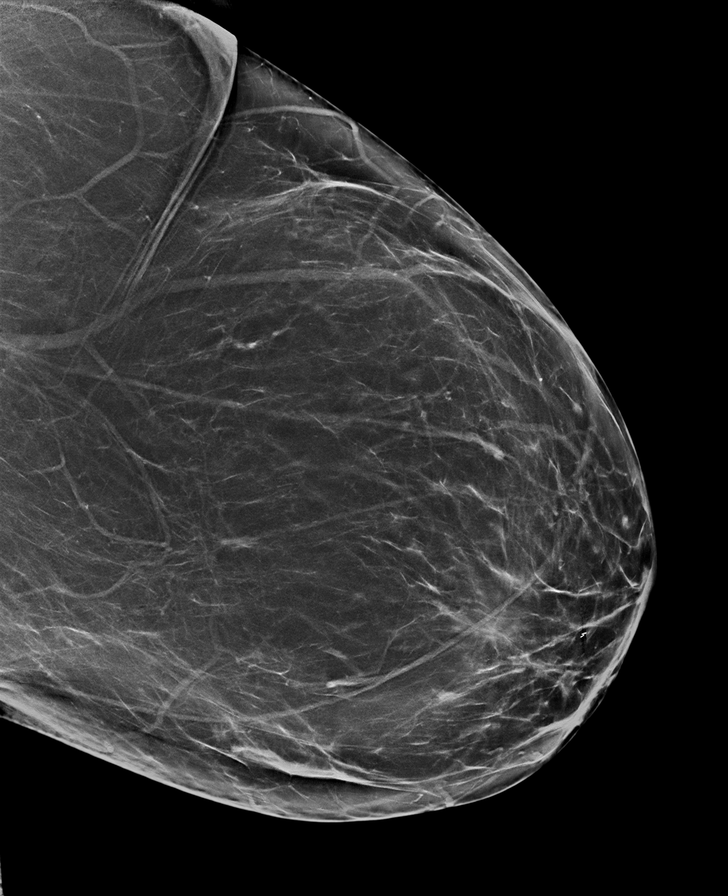

[L MLO synth-2D (2 of 2)]
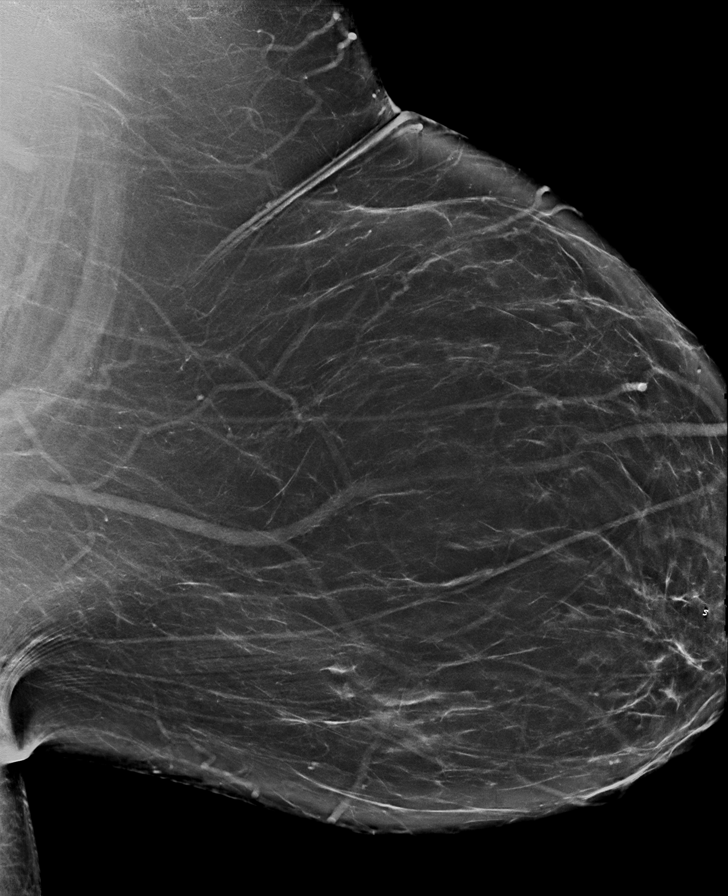

[R CC synth-2D (1 of 2)]
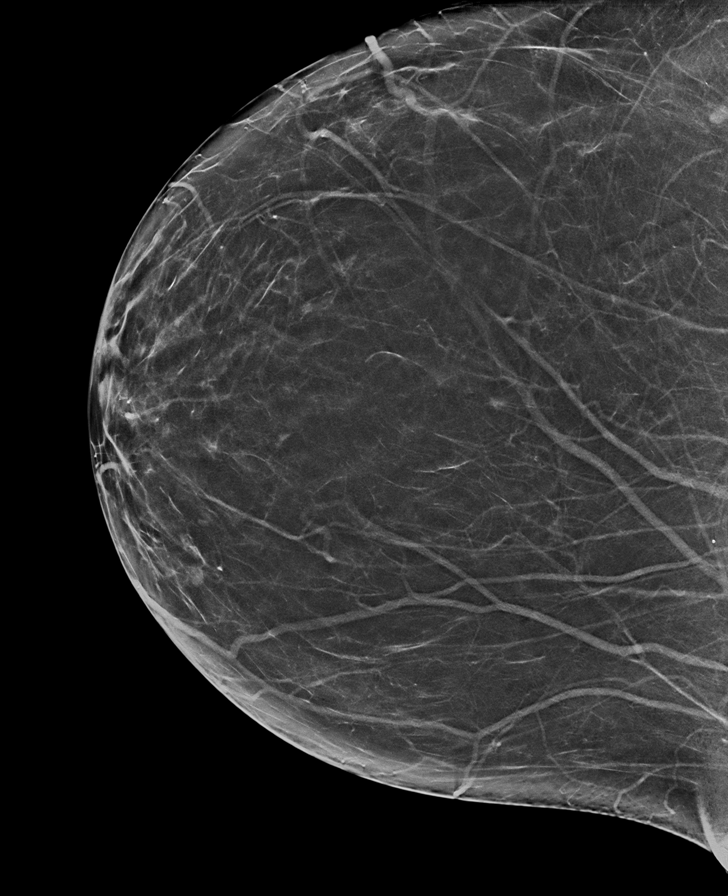

[L CC synth-2D (1 of 2)]
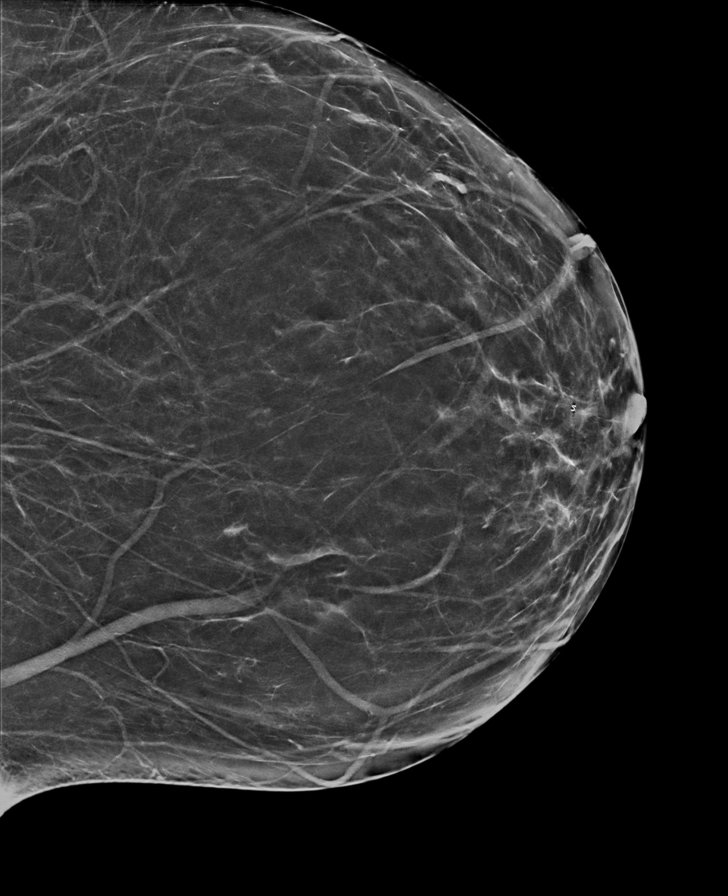

[R MLO synth-2D]
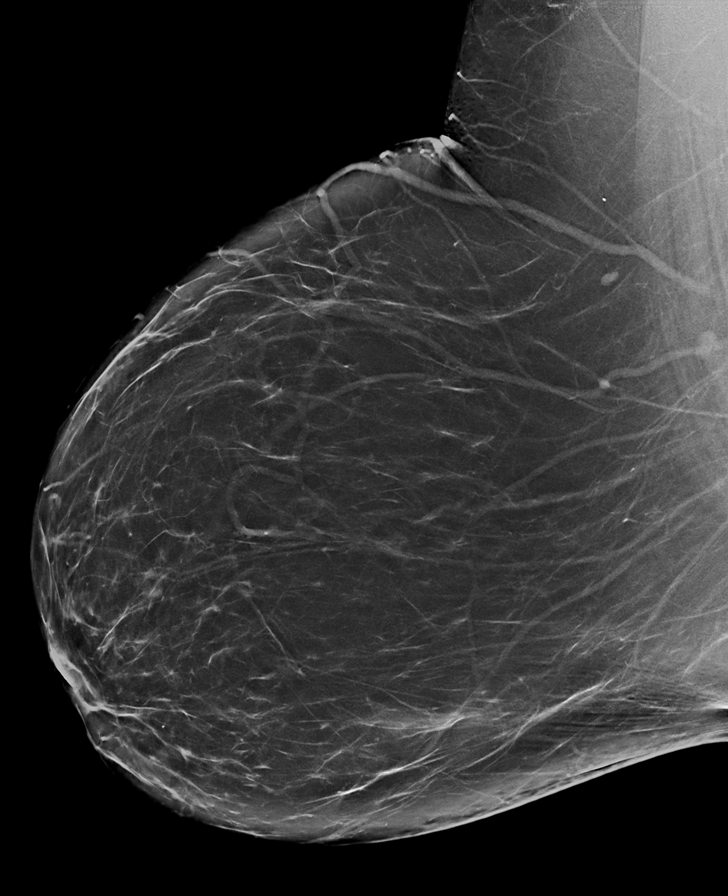

[L CC synth-2D (2 of 2)]
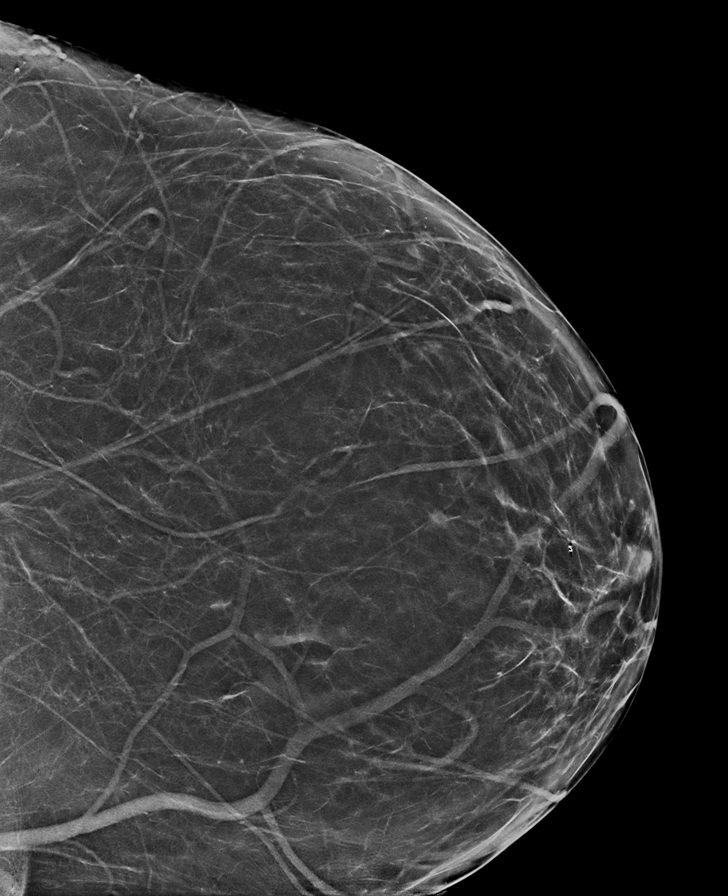

[R CC synth-2D (2 of 2)]
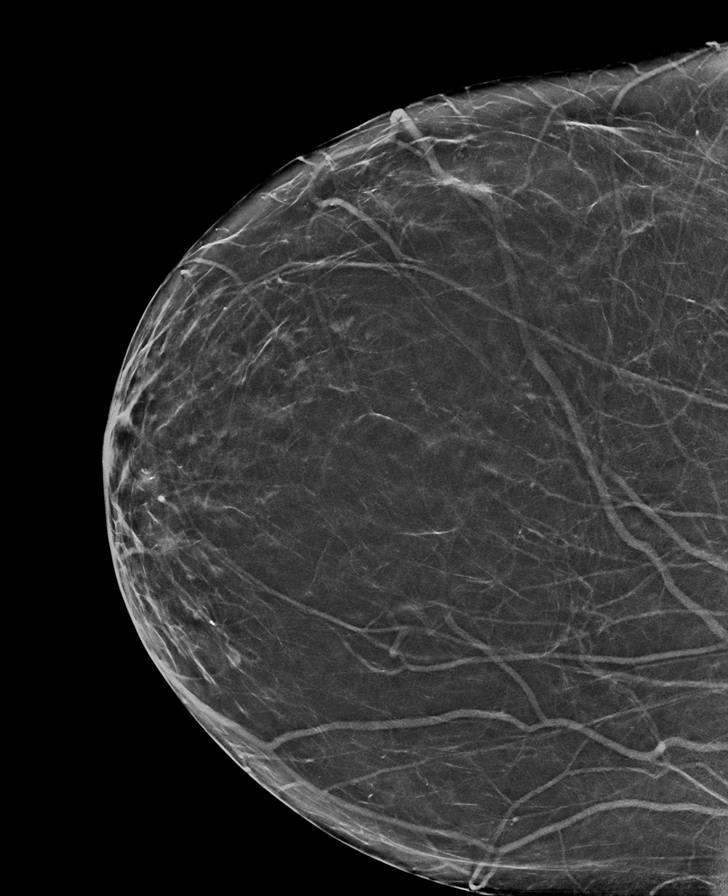

[L CC tomo · tomo slice 39/77.0]
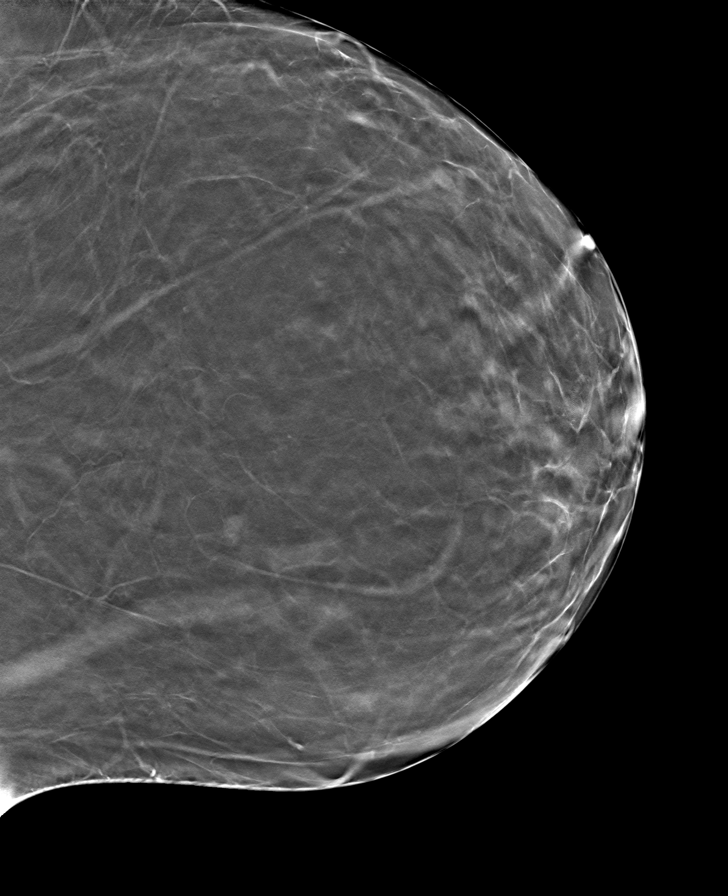

[8 of 40 positions shown; findings below may reference images not displayed]

FINDINGS: There are no findings suspicious for malignancy. Images were
processed with CAD.
IMPRESSION: No mammographic evidence of malignancy. A result letter of this
screening mammogram will be mailed directly to the patient.

RECOMMENDATION:
Screening mammogram in one year. (Code:[id number])

BI-RADS CATEGORY  1: Negative.

## 2021-09-14 ENCOUNTER — Other Ambulatory Visit: Payer: Self-pay | Admitting: Family Medicine

## 2021-09-14 ENCOUNTER — Other Ambulatory Visit: Payer: Self-pay | Admitting: Obstetrics and Gynecology

## 2021-09-14 DIAGNOSIS — Z1231 Encounter for screening mammogram for malignant neoplasm of breast: Secondary | ICD-10-CM

## 2021-10-14 ENCOUNTER — Ambulatory Visit: Payer: BC Managed Care – PPO

## 2021-11-25 ENCOUNTER — Ambulatory Visit
Admission: RE | Admit: 2021-11-25 | Discharge: 2021-11-25 | Disposition: A | Discharge status: Home / Self Care | Payer: BC Managed Care – PPO | Source: Ambulatory Visit | Attending: Obstetrics and Gynecology | Admitting: Obstetrics and Gynecology

## 2021-11-25 DIAGNOSIS — Z1231 Encounter for screening mammogram for malignant neoplasm of breast: Secondary | ICD-10-CM | POA: Insufficient documentation

## 2021-12-14 ENCOUNTER — Ambulatory Visit
Admission: EM | Admit: 2021-12-14 | Discharge: 2021-12-14 | Disposition: A | Discharge status: Home / Self Care | Payer: BC Managed Care – PPO | Attending: Emergency Medicine | Admitting: Emergency Medicine

## 2021-12-14 DIAGNOSIS — N390 Urinary tract infection, site not specified: Secondary | ICD-10-CM | POA: Diagnosis present

## 2021-12-14 LAB — URINALYSIS, ROUTINE W REFLEX MICROSCOPIC
Bilirubin Urine: NEGATIVE
Glucose, UA: NEGATIVE mg/dL
Ketones, ur: NEGATIVE mg/dL
Nitrite: POSITIVE — AB
Protein, ur: NEGATIVE mg/dL
Specific Gravity, Urine: >1.03 — ABNORMAL HIGH (ref 1.005–1.030)
pH: 5.5 (ref 5.0–8.0)

## 2021-12-14 LAB — URINALYSIS, MICROSCOPIC (REFLEX): WBC, UA: >50 WBC/hpf (ref 0–5)

## 2021-12-14 MED ORDER — PHENAZOPYRIDINE HCL 200 MG PO TABS: 200.0000 mg | ORAL_TABLET | Freq: Three times a day (TID) | ORAL | 0 refills | Status: AC

## 2021-12-14 MED ORDER — NITROFURANTOIN MONOHYD MACRO 100 MG PO CAPS: 100.0000 mg | ORAL_CAPSULE | Freq: Two times a day (BID) | ORAL | 0 refills | Status: AC

## 2021-12-14 NOTE — Discharge Instructions (Addendum)

## 2021-12-14 NOTE — ED Provider Notes (Signed)
[facility]    CSN: [id number] Arrival date & time: 12/14/21  1540      History   Chief Complaint Chief Complaint  Patient presents with   Dysuria    HPI Brenda Hancock is a 56 y.o. female.   HPI  56 year old female here for evaluation of urinary complaints.  The patient reports that she developed some burning with urination along with urinary urgency and frequency this morning.  She states that she has noticed that her urine has been a little more cloudy but she has not seen any blood in her urine.  She denies low back pain, abdominal pain, nausea or vomiting.  She also denies any vaginal discharge or itching.  Past Medical History:  Diagnosis Date   Anxiety    Arthritis    SHOULDER AND FOOT   Depression    Diabetes mellitus without complication (HCC)    GERD (gastroesophageal reflux disease)    History of kidney stones    X1   Hyperlipidemia    Hypertension     Patient Active Problem List   Diagnosis Date Noted   Abnormal mammogram 10/05/2012    Past Surgical History:  Procedure Laterality Date   BACK SURGERY  2010   LUMBAR   BREAST BIOPSY Left 09/2012   neg bx/clip   COLONOSCOPY WITH PROPOFOL N/A 08/29/2018   Procedure: COLONOSCOPY WITH PROPOFOL;  Surgeon: Toledo, Benay Pike, MD;  Location: [facility];  Service: Gastroenterology;  Laterality: N/A;   SHOULDER ARTHROSCOPY WITH SUBACROMIAL DECOMPRESSION AND OPEN ROTATOR C Right 04/06/2020   Procedure: Right shoulder arthroscopic , subacromial decompression, distal clavicle excision, and biceps tenodesis - Reche Dixon to Assist;  Surgeon: Leim Fabry, MD;  Location: [facility];  Service: Orthopedics;  Laterality: Right;    OB History     Gravida  2   Para  2   Term  2   Preterm      AB      Living  2      SAB      IAB      Ectopic      Multiple      Live Births           Obstetric Comments  1st Menstrual Cycle:  14 1st Pregnancy:  26          Home Medications     Prior to Admission medications   Medication Sig Start Date End Date Taking? Authorizing Provider  acetaminophen (TYLENOL) 500 MG tablet Take 1,000 mg by mouth every 6 (six) hours as needed for moderate pain.   Yes [provider]  amLODipine (NORVASC) 2.5 MG tablet Take 2.5 mg by mouth every morning.   Yes [provider]  buPROPion (WELLBUTRIN XL) 300 MG 24 hr tablet Take 300 mg by mouth every morning.   Yes [provider]  esomeprazole (NEXIUM) 20 MG capsule Take 20 mg by mouth every Tuesday.   Yes [provider]  fexofenadine (ALLEGRA) 180 MG tablet Take 180 mg by mouth daily.   Yes [provider]  hydrochlorothiazide (MICROZIDE) 12.5 MG capsule Take 12.5 mg by mouth daily.   Yes [provider]  Levonorgestrel (LILETTA) 19.5 MCG/DAY IUD IUD 1 each by Intrauterine route once.   Yes [provider]  nitrofurantoin, macrocrystal-monohydrate, (MACROBID) 100 MG capsule Take 1 capsule (100 mg total) by mouth 2 (two) times daily. 12/14/21  Yes Margarette Canada, NP  phenazopyridine (PYRIDIUM) 200 MG tablet Take 1 tablet (  200 mg total) by mouth 3 (three) times daily. 12/14/21  Yes Becky Augusta, NP  pioglitazone (ACTOS) 45 MG tablet Take 45 mg by mouth daily.   Yes [provider]  Semaglutide, 2 MG/DOSE, (OZEMPIC, 2 MG/DOSE,) 8 MG/3ML SOPN Inject into the skin.   Yes [provider]  venlafaxine XR (EFFEXOR-XR) 150 MG 24 hr capsule Take 150 mg by mouth daily with breakfast. 09/29/12  Yes [provider]  ondansetron (ZOFRAN ODT) 4 MG disintegrating tablet Take 1 tablet (4 mg total) by mouth every 8 (eight) hours as needed for nausea or vomiting. 04/06/20   Signa Kell, MD    Family History Family History  Problem Relation Age of Onset   Hypertension Mother    Diabetes Mother    Cancer Father    Hypertension Father    Diabetes Father    Breast cancer Neg Hx     Social History Social History   Tobacco  Use   Smoking status: Never   Smokeless tobacco: Never  Vaping Use   Vaping Use: Never used  Substance Use Topics   Alcohol use: Yes    Comment: RARE   Drug use: No     Allergies   Metformin, Augmentin [amoxicillin-pot clavulanate], and Keflex [cephalexin]   Review of Systems Review of Systems  Constitutional:  Negative for fever.  Gastrointestinal:  Negative for abdominal pain, nausea and vomiting.  Genitourinary:  Positive for dysuria, frequency and urgency. Negative for hematuria, vaginal discharge and vaginal pain.  Musculoskeletal:  Negative for back pain.  Hematological: Negative.   Psychiatric/Behavioral: Negative.       Physical Exam Triage Vital Signs ED Triage Vitals  Enc Vitals Group     BP 12/14/21 1624 (!) 135/96     Pulse Rate 12/14/21 1624 86     Resp 12/14/21 1624 18     Temp 12/14/21 1624 98.5 F (36.9 C)     Temp Source 12/14/21 1624 Oral     SpO2 12/14/21 1624 98 %     Weight --      Height --      Head Circumference --      Peak Flow --      Pain Score 12/14/21 1618 7     Pain Loc --      Pain Edu? --      Excl. in GC? --    No data found.  Updated Vital Signs BP (!) 135/96 (BP Location: Right Wrist)   Pulse 86   Temp 98.5 F (36.9 C) (Oral)   Resp 18   SpO2 98%   Visual Acuity Right Eye Distance:   Left Eye Distance:   Bilateral Distance:    Right Eye Near:   Left Eye Near:    Bilateral Near:     Physical Exam Vitals and nursing note reviewed.  Constitutional:      Appearance: Normal appearance. She is not ill-appearing.  HENT:     Head: Normocephalic and atraumatic.  Cardiovascular:     Rate and Rhythm: Normal rate and regular rhythm.     Pulses: Normal pulses.     Heart sounds: Normal heart sounds. No murmur heard.    No friction rub. No gallop.  Pulmonary:     Effort: Pulmonary effort is normal.     Breath sounds: Normal breath sounds. No wheezing, rhonchi or rales.  Abdominal:     Tenderness: There is no right  CVA tenderness or left CVA tenderness.  Skin:  General: Skin is warm and dry.     Capillary Refill: Capillary refill takes less than 2 seconds.  Neurological:     General: No focal deficit present.     Mental Status: She is alert and oriented to person, place, and time.  Psychiatric:        Mood and Affect: Mood normal.        Behavior: Behavior normal.        Thought Content: Thought content normal.        Judgment: Judgment normal.      UC Treatments / Results  Labs (all labs ordered are listed, but only abnormal results are displayed) Labs Reviewed  URINALYSIS, ROUTINE W REFLEX MICROSCOPIC - Abnormal; Notable for the following components:      Result Value   APPearance HAZY (*)    Specific Gravity, Urine >1.030 (*)    Hgb urine dipstick TRACE (*)    Nitrite POSITIVE (*)    Leukocytes,Ua SMALL (*)    All other components within normal limits  URINALYSIS, MICROSCOPIC (REFLEX) - Abnormal; Notable for the following components:   Bacteria, UA MANY (*)    All other components within normal limits  URINE CULTURE    EKG   Radiology No results found.  Procedures Procedures (including critical care time)  Medications Ordered in UC Medications - No data to display  Initial Impression / Assessment and Plan / UC Course  I have reviewed the triage vital signs and the nursing notes.  Pertinent labs & imaging results that were available during my care of the patient were reviewed by me and considered in my medical decision making (see chart for details).   Patient is a pleasant, nontoxic-appearing 56 year old female here for evaluation of urinary complaints as outlined HPI above.  Her physical exam reveals no CVA tenderness and her abdomen is soft and nontender.  She is not endorsing any vaginal complaints she has urgency, dysuria, and frequency of urination.  I will order a urinalysis to look for the presence of UTI.  Urinalysis shows hazy appearance with trace hemoglobin,  nitrite positive, small leukocyte esterase.  Negative for protein.  Specific gravity is high at >1.030.  Reflex microscopy shows >50 WBCs, many bacteria, and WBC clumps.  I will send urine for culture.  Patient has allergy to Keflex so I will discharge her home on Macrobid 100 mg twice daily for 5 days for treatment of her urinary tract infection.  I will also discharge her home on 200 mg of Pyridium every 8 hours as needed for urinary discomfort.  If we need to make an adjustment to the antibiotics based on the urine culture we will call her and let her know but otherwise she is to finish her antibiotic course.   Final Clinical Impressions(s) / UC Diagnoses   Final diagnoses:  Lower urinary tract infectious disease     Discharge Instructions      Take the Macrobid twice daily for 5 days with food for treatment of urinary tract infection.  Use the Pyridium every 8 hours as needed for urinary discomfort.  This will turn your urine a bright red-orange.  Increase your oral fluid intake so that you increase your urine production and or flushing your urinary system.  Take an over-the-counter probiotic, such as Culturelle-Align-Activia, 1 hour after each dose of antibiotic to prevent diarrhea or yeast infections from forming.  We will culture urine and change the antibiotics if necessary.  Return for reevaluation, or see your  primary care provider, for any new or worsening symptoms.      ED Prescriptions     Medication Sig Dispense Auth. Provider   nitrofurantoin, macrocrystal-monohydrate, (MACROBID) 100 MG capsule Take 1 capsule (100 mg total) by mouth 2 (two) times daily. 10 capsule Becky Augusta, NP   phenazopyridine (PYRIDIUM) 200 MG tablet Take 1 tablet (200 mg total) by mouth 3 (three) times daily. 6 tablet Becky Augusta, NP      PDMP not reviewed this encounter.   Becky Augusta, NP 12/14/21 1650

## 2021-12-14 NOTE — ED Triage Notes (Signed)
Pt presents with frequency and dysuria x 1 day.  Denies abdominal, flank pain.  Denies concern for STD.  No OTC tx.

## 2021-12-17 LAB — URINE CULTURE: Culture: >=100000 — AB

## 2022-10-28 ENCOUNTER — Other Ambulatory Visit: Payer: Self-pay | Admitting: Obstetrics and Gynecology

## 2022-10-28 DIAGNOSIS — Z1231 Encounter for screening mammogram for malignant neoplasm of breast: Secondary | ICD-10-CM

## 2022-12-01 ENCOUNTER — Ambulatory Visit
Admission: RE | Admit: 2022-12-01 | Discharge: 2022-12-01 | Disposition: A | Discharge status: Home / Self Care | Payer: BC Managed Care – PPO | Source: Ambulatory Visit | Attending: Obstetrics and Gynecology | Admitting: Obstetrics and Gynecology

## 2022-12-01 DIAGNOSIS — Z1231 Encounter for screening mammogram for malignant neoplasm of breast: Secondary | ICD-10-CM | POA: Insufficient documentation

## 2023-10-18 ENCOUNTER — Other Ambulatory Visit: Payer: Self-pay | Admitting: Obstetrics and Gynecology

## 2023-10-18 DIAGNOSIS — Z1231 Encounter for screening mammogram for malignant neoplasm of breast: Secondary | ICD-10-CM

## 2023-12-05 ENCOUNTER — Ambulatory Visit
Admission: RE | Admit: 2023-12-05 | Discharge: 2023-12-05 | Disposition: A | Discharge status: Home / Self Care | Payer: Self-pay | Source: Ambulatory Visit | Attending: Obstetrics and Gynecology | Admitting: Obstetrics and Gynecology

## 2023-12-05 DIAGNOSIS — Z1231 Encounter for screening mammogram for malignant neoplasm of breast: Secondary | ICD-10-CM | POA: Diagnosis present
